# Patient Record
Sex: Female | Born: 1965 | Race: White | Hispanic: No | Smoking: Former smoker
Health system: Southern US, Community
[De-identification: ages and names within clinical notes are randomized; demographics above are authoritative.]

## PROBLEM LIST (undated history)

## (undated) DIAGNOSIS — F32A Depression, unspecified: Secondary | ICD-10-CM

## (undated) DIAGNOSIS — M1990 Unspecified osteoarthritis, unspecified site: Secondary | ICD-10-CM

## (undated) DIAGNOSIS — N189 Chronic kidney disease, unspecified: Secondary | ICD-10-CM

## (undated) DIAGNOSIS — E119 Type 2 diabetes mellitus without complications: Secondary | ICD-10-CM

## (undated) DIAGNOSIS — F419 Anxiety disorder, unspecified: Secondary | ICD-10-CM

## (undated) HISTORY — DX: Depression, unspecified: F32.A

## (undated) HISTORY — PX: FOOT SURGERY: SHX648

## (undated) HISTORY — PX: APPENDECTOMY: SHX54

## (undated) HISTORY — PX: ABDOMINAL HYSTERECTOMY: SHX81

## (undated) HISTORY — DX: Type 2 diabetes mellitus without complications: E11.9

## (undated) HISTORY — PX: COLON SURGERY: SHX602

## (undated) HISTORY — DX: Unspecified osteoarthritis, unspecified site: M19.90

---

## 2019-08-11 ENCOUNTER — Ambulatory Visit: Payer: Self-pay | Attending: Internal Medicine

## 2019-08-11 DIAGNOSIS — Z23 Encounter for immunization: Secondary | ICD-10-CM | POA: Insufficient documentation

## 2019-08-11 NOTE — Progress Notes (Signed)
   [id number]  Name:  Shequila Neglia X.    MRN: [id number] DOB: 11-15-1965  08/11/2019  Ms. Schipman Aline August was observed post Covid-19 immunization for 15 minutes without incident. She was provided with Vaccine Information Sheet and instruction to access the V-Safe system.   Ms. Maleta Pacha was instructed to call 911 with any severe reactions post vaccine: Marland Kitchen Difficulty breathing  . Swelling of face and throat  . A fast heartbeat  . A bad rash all over body  . Dizziness and weakness   Immunizations Administered    Name Date Dose VIS Date Route   Pfizer COVID-19 Vaccine 08/11/2019  1:12 PM 0.3 mL 05/17/2019 Intramuscular   Manufacturer: [employer], [employer]   Lot: [id number]   NDC: [address]-0

## 2019-09-10 ENCOUNTER — Ambulatory Visit: Payer: Self-pay | Attending: Internal Medicine

## 2019-09-10 DIAGNOSIS — Z23 Encounter for immunization: Secondary | ICD-10-CM

## 2019-09-10 NOTE — Progress Notes (Signed)
   [id number]  Name:  Marra Fraga X.    MRN: [id number] DOB: May 11, 1966  09/10/2019  Ms. Schipman Aline August was observed post Covid-19 immunization for 15 minutes without incident. She was provided with Vaccine Information Sheet and instruction to access the V-Safe system.   Ms. Anyah Swallow was instructed to call 911 with any severe reactions post vaccine: Marland Kitchen Difficulty breathing  . Swelling of face and throat  . A fast heartbeat  . A bad rash all over body  . Dizziness and weakness   Immunizations Administered    Name Date Dose VIS Date Route   Pfizer COVID-19 Vaccine 09/10/2019 11:26 AM 0.3 mL 05/17/2019 Intramuscular   Manufacturer: [employer], [employer]   Lot: [id number]   NDC: [address]-9

## 2019-12-04 ENCOUNTER — Other Ambulatory Visit: Payer: Self-pay

## 2019-12-05 ENCOUNTER — Encounter: Payer: Self-pay | Admitting: Family Medicine

## 2019-12-05 ENCOUNTER — Ambulatory Visit (INDEPENDENT_AMBULATORY_CARE_PROVIDER_SITE_OTHER): Payer: Medicare Other | Admitting: Family Medicine

## 2019-12-05 VITALS — BP 108/70 | HR 75 | Temp 96.9°F | Ht 63.0 in | Wt 212.3 lb

## 2019-12-05 DIAGNOSIS — I10 Essential (primary) hypertension: Secondary | ICD-10-CM | POA: Diagnosis not present

## 2019-12-05 DIAGNOSIS — G894 Chronic pain syndrome: Secondary | ICD-10-CM | POA: Diagnosis not present

## 2019-12-05 DIAGNOSIS — E7800 Pure hypercholesterolemia, unspecified: Secondary | ICD-10-CM | POA: Diagnosis not present

## 2019-12-05 DIAGNOSIS — F418 Other specified anxiety disorders: Secondary | ICD-10-CM

## 2019-12-05 DIAGNOSIS — F5105 Insomnia due to other mental disorder: Secondary | ICD-10-CM | POA: Diagnosis not present

## 2019-12-05 DIAGNOSIS — E6609 Other obesity due to excess calories: Secondary | ICD-10-CM

## 2019-12-05 DIAGNOSIS — E119 Type 2 diabetes mellitus without complications: Secondary | ICD-10-CM

## 2019-12-05 DIAGNOSIS — Z0000 Encounter for general adult medical examination without abnormal findings: Secondary | ICD-10-CM | POA: Insufficient documentation

## 2019-12-05 DIAGNOSIS — Z6837 Body mass index (BMI) 37.0-37.9, adult: Secondary | ICD-10-CM

## 2019-12-05 NOTE — Progress Notes (Signed)
New Patient Office Visit  Subjective:  Patient ID: Kayla Ash., female    DOB: 03-12-1966  Age: 54 y.o. MRN: [id number]  CC:  Chief Complaint  Patient presents with  . Establish Care    New patient, no concerns    HPI Kayla Ash. presents for establishment of care for ongoing follow-up.  Past medical history of hypertension, elevated cholesterol, diabetes, depression, chronic pain syndrome secondary to degenerative disc disease in her lumbar spine, and obesity.  Status post complete abdominal hysterectomy in 2004.  She has been on Estrace since that time.  She has yearly mammograms.  Status post recent eye check.  Recently diagnosed with nonalcoholic fatty liver disease through an abdominal ultrasound.  She is seeing pain management.  Exercise is difficult for her secondary to her chronic arthritic pains.  She was a pediatric nurse for 27 years.  She is married.  She takes Ambien nightly.  Denies any sleep aberration.  Past Medical History:  Diagnosis Date  . Arthritis   . Depression   . Diabetes mellitus without complication [facility])     Past Surgical History:  Procedure Laterality Date  . ABDOMINAL HYSTERECTOMY    . APPENDECTOMY    . COLON SURGERY    . FOOT SURGERY      Family History  Problem Relation Age of Onset  . Diabetes Mother   . Cancer Mother   . Breast cancer Mother   . Stroke Father     Social History   Socioeconomic History  . Marital status: Legally Separated    Spouse name: Not on file  . Number of children: Not on file  . Years of education: Not on file  . Highest education level: Not on file  Occupational History  . Not on file  Tobacco Use  . Smoking status: Never Smoker  . Smokeless tobacco: Never Used  Vaping Use  . Vaping Use: Never used  Substance and Sexual Activity  . Alcohol use: Never  . Drug use: Never  . Sexual activity: Yes  Other Topics Concern  . Not on file  Social History Narrative  . Not on file    Social Determinants of Health   Financial Resource Strain:   . Difficulty of Paying Living Expenses:   Food Insecurity:   . Worried About Charity fundraiser in the Last Year:   . Arboriculturist in the Last Year:   Transportation Needs:   . Film/video editor (Medical):   Marland Kitchen Lack of Transportation (Non-Medical):   Physical Activity:   . Days of Exercise per Week:   . Minutes of Exercise per Session:   Stress:   . Feeling of Stress :   Social Connections:   . Frequency of Communication with Friends and Family:   . Frequency of Social Gatherings with Friends and Family:   . Attends Religious Services:   . Active Member of Clubs or Organizations:   . Attends Archivist Meetings:   Marland Kitchen Marital Status:   Intimate Partner Violence:   . Fear of Current or Ex-Partner:   . Emotionally Abused:   Marland Kitchen Physically Abused:   . Sexually Abused:     ROS Review of Systems  Constitutional: Negative.   HENT: Negative.   Respiratory: Negative.   Cardiovascular: Negative.   Gastrointestinal: Negative.   Endocrine: Negative for polyphagia.  Genitourinary: Negative.   Musculoskeletal: Positive for arthralgias, back pain and gait problem.  Skin: Negative for  pallor and rash.  Allergic/Immunologic: Negative for immunocompromised state.  Neurological: Negative for tremors and speech difficulty.  Psychiatric/Behavioral: Positive for dysphoric mood and sleep disturbance.    Objective:   Today's Vitals: BP 108/70   Pulse 75   Temp (!) 96.9 F (36.1 C) (Tympanic)   Ht 5' 3"  (1.6 m)   Wt 212 lb 4.8 oz (96.3 kg)   SpO2 95%   BMI 37.61 kg/m   Physical Exam Nursing note reviewed.  Constitutional:      Appearance: Normal appearance. She is obese.  HENT:     Head: Normocephalic and atraumatic.     Right Ear: Tympanic membrane, ear canal and external ear normal.     Left Ear: Tympanic membrane, ear canal and external ear normal.  Eyes:     General: No scleral icterus.        Right eye: No discharge.        Left eye: No discharge.     Extraocular Movements: Extraocular movements intact.     Conjunctiva/sclera: Conjunctivae normal.     Pupils: Pupils are equal, round, and reactive to light.  Cardiovascular:     Rate and Rhythm: Normal rate and regular rhythm.     Pulses:          Dorsalis pedis pulses are 1+ on the right side and 1+ on the left side.       Posterior tibial pulses are 1+ on the right side and 1+ on the left side.  Pulmonary:     Effort: Pulmonary effort is normal.     Breath sounds: Normal breath sounds.  Musculoskeletal:     Cervical back: No rigidity or tenderness.     Right lower leg: No edema.     Left lower leg: No edema.  Lymphadenopathy:     Cervical: No cervical adenopathy.  Skin:    General: Skin is warm and dry.  Neurological:     Mental Status: She is alert and oriented to person, place, and time.  Psychiatric:        Mood and Affect: Mood normal.        Behavior: Behavior normal.     Assessment & Plan:   Problem List Items Addressed This Visit      Cardiovascular and Mediastinum   Essential hypertension   Relevant Medications   lisinopril (ZESTRIL) 5 MG tablet   rosuvastatin (CRESTOR) 40 MG tablet     Endocrine   Type 2 diabetes mellitus without complication, without long-term current use of insulin (HCC)   Relevant Medications   lisinopril (ZESTRIL) 5 MG tablet   metFORMIN (GLUCOPHAGE-XR) 500 MG 24 hr tablet   rosuvastatin (CRESTOR) 40 MG tablet     Other   Chronic pain syndrome   Relevant Medications   FLUoxetine (PROZAC) 20 MG capsule   ibuprofen (ADVIL) 200 MG tablet   oxyCODONE (ROXICODONE) 15 MG immediate release tablet   OXYCONTIN 60 MG 12 hr tablet   Elevated cholesterol   Relevant Medications   lisinopril (ZESTRIL) 5 MG tablet   rosuvastatin (CRESTOR) 40 MG tablet   Insomnia secondary to depression with anxiety - Primary   Relevant Medications   FLUoxetine (PROZAC) 20 MG capsule   Class 2  obesity due to excess calories with body mass index (BMI) of 37.0 to 37.9 in adult   Relevant Medications   metFORMIN (GLUCOPHAGE-XR) 500 MG 24 hr tablet   Other Relevant Orders   Amb Ref to Medical Weight Management   Healthcare  maintenance   Relevant Orders   Ambulatory referral to Gynecology      Outpatient Encounter Medications as of 12/05/2019  Medication Sig  . albuterol (VENTOLIN HFA) 108 (90 Base) MCG/ACT inhaler Inhale into the lungs.  . Blood Glucose Monitoring Suppl (ONE TOUCH ULTRA 2) w/Device KIT   . Blood Glucose Monitoring Suppl (ONE TOUCH ULTRA 2) w/Device KIT   . budesonide-formoterol (SYMBICORT) 160-4.5 MCG/ACT inhaler Inhale into the lungs.  Marland Kitchen estradiol (ESTRACE) 2 MG tablet Take 2 mg by mouth daily.  Marland Kitchen FLUoxetine (PROZAC) 20 MG capsule Take 20 mg by mouth daily.  . fluticasone (FLONASE) 50 MCG/ACT nasal spray 2 sprays by Each Nare route daily.  Marland Kitchen glucose blood test strip   . glycopyrrolate (ROBINUL) 2 MG tablet Take 2 mg by mouth daily.  Marland Kitchen ibuprofen (ADVIL) 200 MG tablet Take by mouth.  Marland Kitchen LINZESS 290 MCG CAPS capsule Take 290 mcg by mouth daily.  Marland Kitchen lisinopril (ZESTRIL) 5 MG tablet Take 5 mg by mouth daily.  . Magnesium Oxide -Mg Supplement 250 MG TABS Take 250 tablets by mouth daily.  . metFORMIN (GLUCOPHAGE-XR) 500 MG 24 hr tablet Take 500 mg by mouth 4 (four) times daily.  . montelukast (SINGULAIR) 10 MG tablet Take 10 mg by mouth at bedtime.  Marland Kitchen omeprazole (PRILOSEC) 20 MG capsule   . ondansetron (ZOFRAN) 8 MG tablet TK 1 T PO D  . oxyCODONE (ROXICODONE) 15 MG immediate release tablet Take 15 mg by mouth every 4 (four) hours.  . OXYCONTIN 60 MG 12 hr tablet Take 60 tablets by mouth 2 (two) times daily.  . polyethylene glycol powder (GLYCOLAX/MIRALAX) 17 GM/SCOOP powder DISSOLVE 17 GRAMS AND TK PO PRN  . rosuvastatin (CRESTOR) 40 MG tablet Take 40 mg by mouth daily.  Marland Kitchen zolpidem (AMBIEN) 5 MG tablet Take 5 mg by mouth at bedtime as needed.   No  facility-administered encounter medications on file as of 12/05/2019.    Follow-up: Return in about 3 months (around 03/06/2020).   We will proceed with previously ordered liver elasticity scan.  Agrees to go to weight loss management for weight loss and treatment of fatty liver disease.  Will see GYN for possible taper of her Estrace and follow-up well woman care. Libby Maw, MD

## 2019-12-16 ENCOUNTER — Encounter (INDEPENDENT_AMBULATORY_CARE_PROVIDER_SITE_OTHER): Payer: Self-pay

## 2020-01-13 ENCOUNTER — Other Ambulatory Visit: Payer: Self-pay | Admitting: Neurosurgery

## 2020-01-13 DIAGNOSIS — M4317 Spondylolisthesis, lumbosacral region: Secondary | ICD-10-CM

## 2020-01-21 ENCOUNTER — Telehealth: Payer: Self-pay

## 2020-01-21 NOTE — Telephone Encounter (Signed)
Left message for patient to call back. Please schedule Annual Medicare Wellness Exam with Nurse Health Advisor.

## 2020-02-05 ENCOUNTER — Ambulatory Visit
Admission: RE | Admit: 2020-02-05 | Discharge: 2020-02-05 | Disposition: A | Discharge status: Home / Self Care | Payer: Medicare Other | Source: Ambulatory Visit | Attending: Neurosurgery | Admitting: Neurosurgery

## 2020-02-05 ENCOUNTER — Other Ambulatory Visit: Payer: Self-pay

## 2020-02-05 DIAGNOSIS — M4317 Spondylolisthesis, lumbosacral region: Secondary | ICD-10-CM

## 2020-02-05 IMAGING — MR MR LUMBAR SPINE W/O CM
4 of 5 series · 18 of 48 positions shown · non-contrast
Comparison: None.

CLINICAL DATA: Low back pain with left hip and leg pain. Tingling
and numbness.

EXAM:
MRI LUMBAR SPINE WITHOUT CONTRAST
TECHNIQUE: Multiplanar, multisequence MR imaging of the lumbar spine was
performed. No intravenous contrast was administered.

[Series 5: T2 · sagittal · 4.0mm · 0.73mm/px · 6 of 15 slices shown (1 of 2)]
[im 1/15]
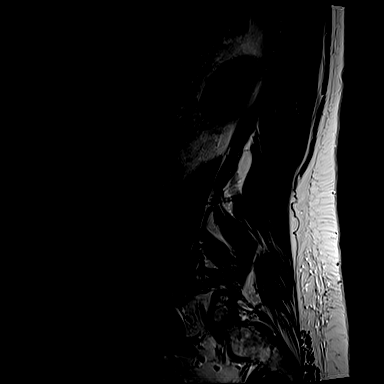
[im 3/15]
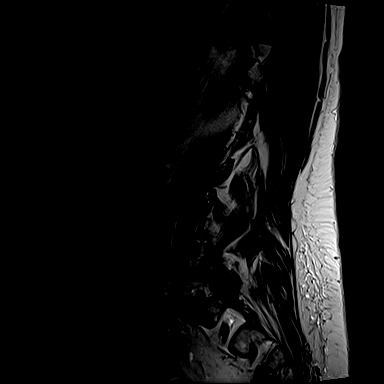
[im 6/15]
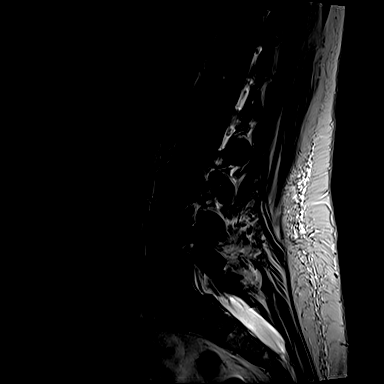
[im 9/15]
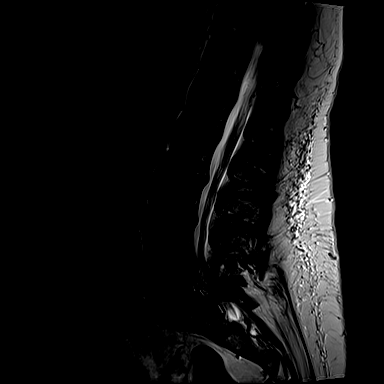
[im 12/15]
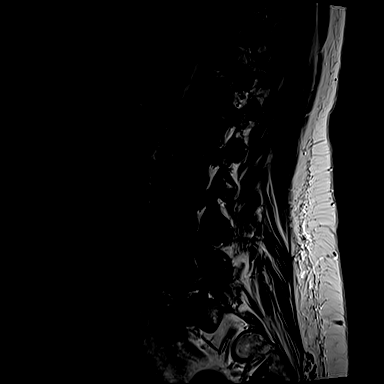
[im 15/15]
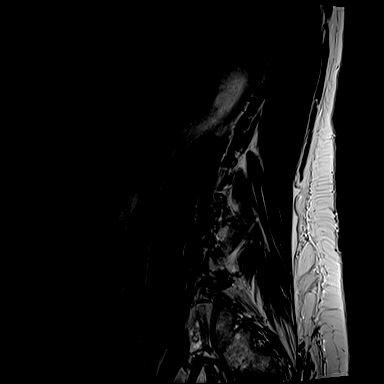

[Series 6: T1 · sagittal · 4.0mm · 0.73mm/px · 3 of 15 slices shown (1 of 2)]
[im 3/15]
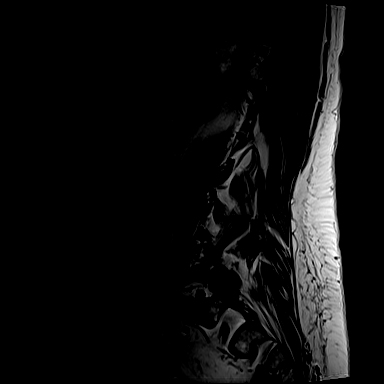
[im 9/15]
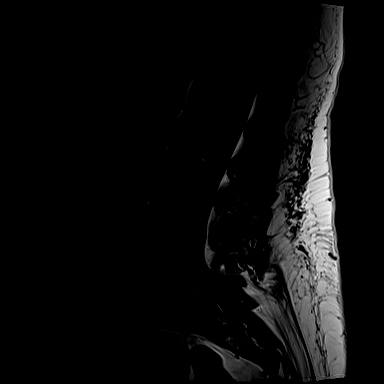
[im 15/15]
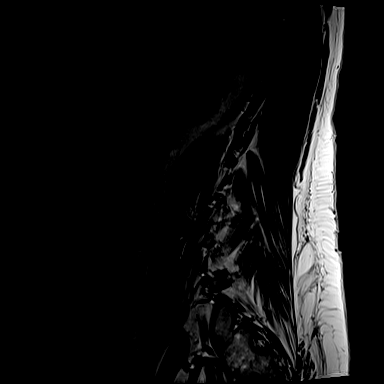

[Series 10: T1 · axial · 4.0mm · 0.28mm/px · z∈[-113,+52]mm · 3 of 39 slices shown (2 of 2)]
[im 6/39]
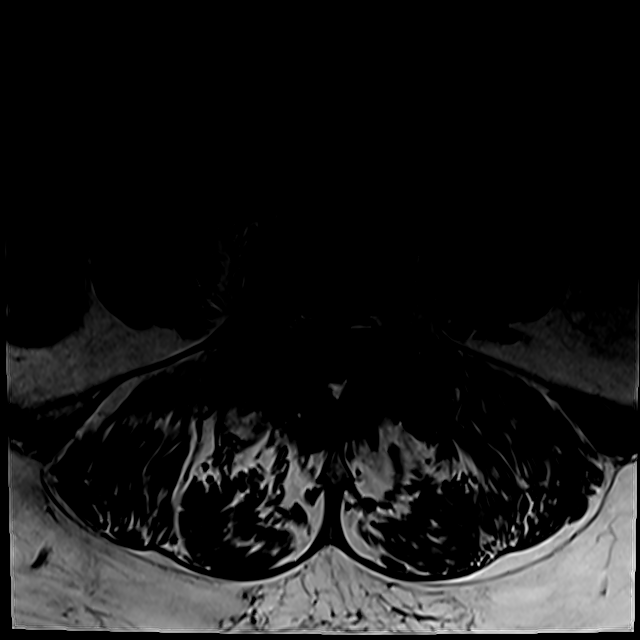
[im 20/39]
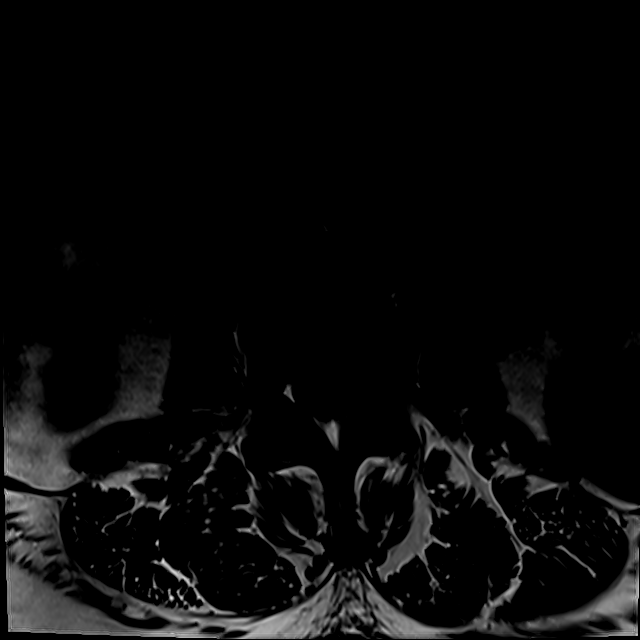
[im 33/39]
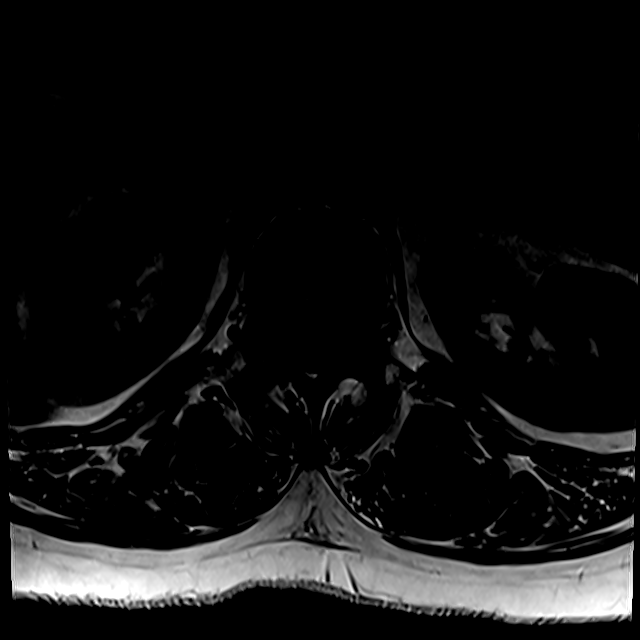

[Series 13: T2 · axial · 4.0mm · 0.28mm/px · z∈[-137,+52]mm · 6 of 39 slices shown (2 of 2)]
[im 1/39]
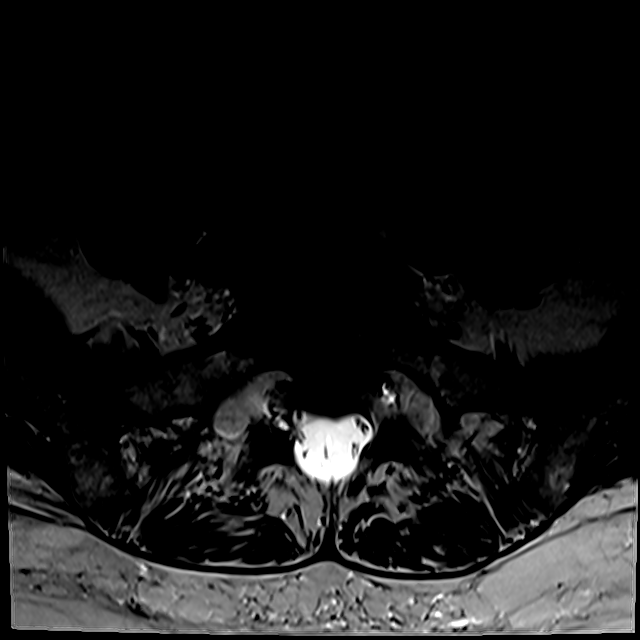
[im 6/39]
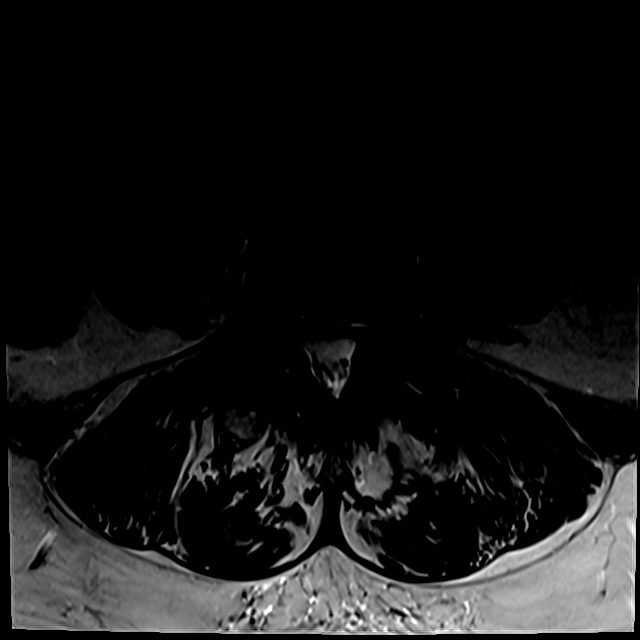
[im 11/39]
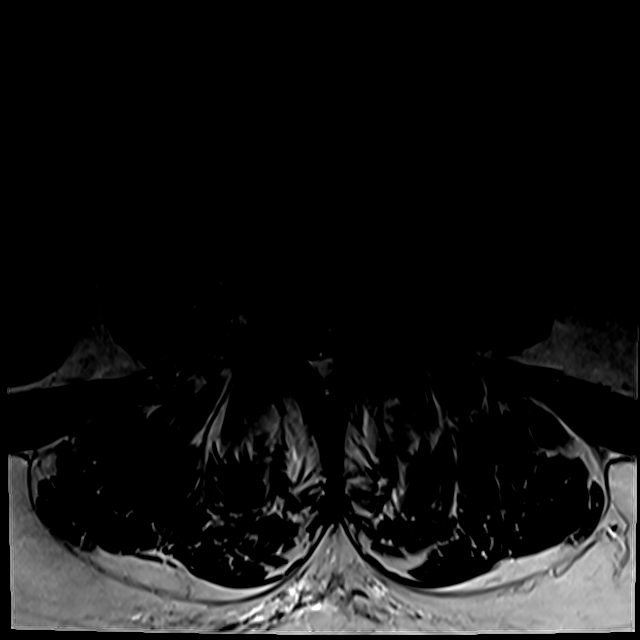
[im 17/39]
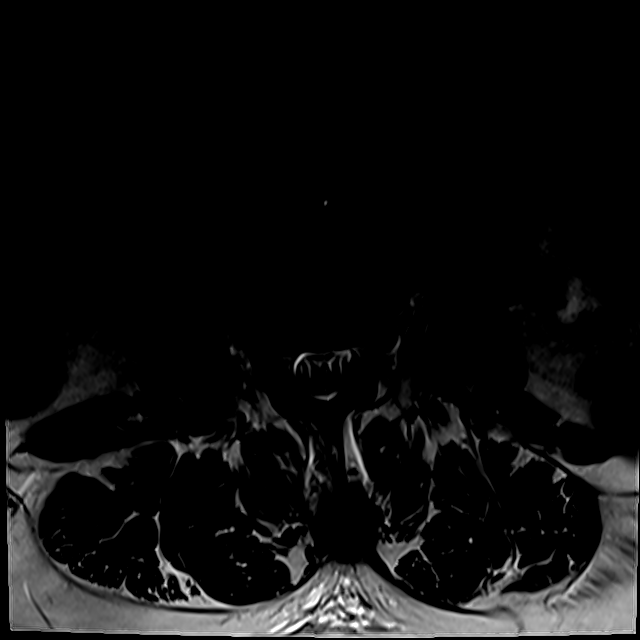
[im 20/39]
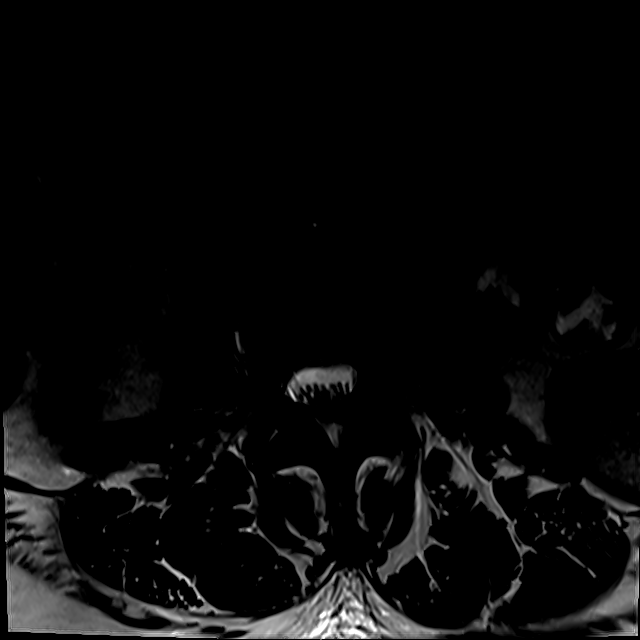
[im 33/39]
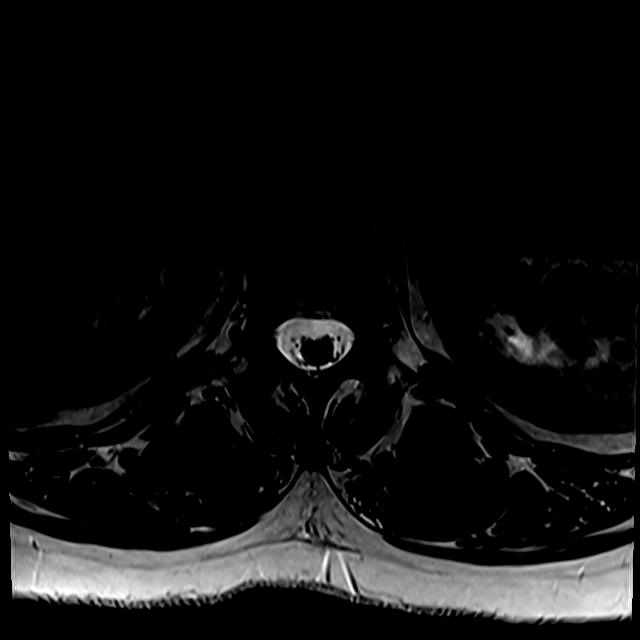

[18 of 48 positions shown; findings below may reference images not displayed]

FINDINGS: Segmentation:  5 lumbar type vertebrae

Alignment: 8 mm of anterolisthesis at L5-S1. No certain pars
defects, limited by the degree of degenerative spurring, but
possible on the right. Severe facet arthritis. Mild dextroscoliosis.

Vertebrae:  No fracture, evidence of discitis, or bone lesion.

Conus medullaris and cauda equina: Conus extends to the L2 level.
Conus and cauda equina appear normal.

Paraspinal and other soft tissues: Negative for perispinal mass or
inflammation. Incidental renal sinus cysts.

Disc levels:

T12- L1: Unremarkable.

L1-L2: Unremarkable.

L2-L3: Disc narrowing and bulging without impingement. Asymmetric
left facet spurring that is still mild

L3-L4: Disc narrowing and bulging. Minor facet spurring. Mild spinal
stenosis

L4-L5: Facet osteoarthritis with bony and ligamentous hypertrophy.
The disc is narrowed and bulging and there is high-grade spinal
stenosis with complete effacement of CSF. Moderate right and
mild-to-moderate left foraminal narrowing.

L5-S1:Severe facet osteoarthritis with spurring and anterolisthesis.
The disc is narrowed and bulging. Right foraminal impingement with
nerve root flattening. Milder left foraminal narrowing. Patent
spinal canal
IMPRESSION: 1. Lower lumbar degeneration with L5-S1 anterolisthesis and mild
scoliosis. There could be L5 pars defect underlying the L5-S1
anterolisthesis, especially on the right.
2. L4-5 high-grade spinal stenosis and moderate right foraminal
impingement.
3. L5-S1 right foraminal impingement.

## 2020-02-20 ENCOUNTER — Other Ambulatory Visit: Payer: Self-pay | Admitting: Neurosurgery

## 2020-03-02 NOTE — Progress Notes (Signed)
[id number] - [place], [place] - [address] AT [facility] [address] [place] [address] Phone: [phone] Fax: [phone]      Your procedure is scheduled on 03/06/20.  Report to [facility] Main Entrance "A" at 7:00 A.M., and check in at the Admitting office.  Call this number if you have problems the morning of surgery:  [phone]  Call [phone] if you have any questions prior to your surgery date Monday-Friday 8am-4pm    Remember:  Do not eat or drink after midnight the night before your surgery    Take these medicines the morning of surgery with A SIP OF WATER: albuterol (VENTOLIN HFA) - as prescribed budesonide-formoterol (SYMBICORT) estradiol (ESTRACE) FLUoxetine (PROZAC)  fluticasone (FLONASE)  glycopyrrolate (ROBINUL) ondansetron (ZOFRAN) - as needed oxyCODONE (ROXICODONE) - as needed OXYCONTIN   As of today, STOP taking any Aspirin (unless otherwise instructed by your surgeon) Aleve, Naproxen, Ibuprofen, Motrin, Advil, Goody's, BC's, all herbal medications, fish oil, and all vitamins.   WHAT DO I DO ABOUT MY DIABETES MEDICATION?   Marland Kitchen Do not take oral diabetes medicines (pills) the morning of surgery.  HOW TO MANAGE YOUR DIABETES BEFORE AND AFTER SURGERY  Why is it important to control my blood sugar before and after surgery? . Improving blood sugar levels before and after surgery helps healing and can limit problems. . A way of improving blood sugar control is eating a healthy diet by: o  Eating less sugar and carbohydrates o  Increasing activity/exercise o  Talking with your doctor about reaching your blood sugar goals . High blood sugars (greater than 180 mg/dL) can raise your risk of infections and slow your recovery, so you will need to focus on controlling your diabetes during the weeks before surgery. . Make sure that the doctor who takes care of your diabetes knows about your planned surgery including the date and  location.  How do I manage my blood sugar before surgery? . Check your blood sugar at least 4 times a day, starting 2 days before surgery, to make sure that the level is not too high or low. . Check your blood sugar the morning of your surgery when you wake up and every 2 hours until you get to the Short Stay unit. o If your blood sugar is less than 70 mg/dL, you will need to treat for low blood sugar: - Do not take insulin. - Treat a low blood sugar (less than 70 mg/dL) with  cup of clear juice (cranberry or apple), 4 glucose tablets, OR glucose gel. - Recheck blood sugar in 15 minutes after treatment (to make sure it is greater than 70 mg/dL). If your blood sugar is not greater than 70 mg/dL on recheck, call [phone] for further instructions. . Report your blood sugar to the short stay nurse when you get to Short Stay.  . If you are admitted to the hospital after surgery: o Your blood sugar will be checked by the staff and you will probably be given insulin after surgery (instead of oral diabetes medicines) to make sure you have good blood sugar levels. o The goal for blood sugar control after surgery is 80-180 mg/dL.                  Do not wear jewelry, make up, or nail polish            Do not wear lotions, powders, perfumes or deodorant.  Do not shave 48 hours prior to surgery.              Do not bring valuables to the hospital.            [facility] is not responsible for any belongings or valuables.  Do NOT Smoke (Tobacco/Vaping) or drink Alcohol 24 hours prior to your procedure If you use a CPAP at night, you may bring all equipment for your overnight stay.   Contacts, glasses, dentures or bridgework may not be worn into surgery.      For patients admitted to the hospital, discharge time will be determined by your treatment team.   Patients discharged the day of surgery will not be allowed to drive home, and someone needs to stay with them for 24  hours.    Special instructions:   [facility]- Preparing For Surgery  Before surgery, you can play an important role. Because skin is not sterile, your skin needs to be as free of germs as possible. You can reduce the number of germs on your skin by washing with CHG (chlorahexidine gluconate) Soap before surgery.  CHG is an antiseptic cleaner which kills germs and bonds with the skin to continue killing germs even after washing.    Oral Hygiene is also important to reduce your risk of infection.  Remember - BRUSH YOUR TEETH THE MORNING OF SURGERY WITH YOUR REGULAR TOOTHPASTE  Please do not use if you have an allergy to CHG or antibacterial soaps. If your skin becomes reddened/irritated stop using the CHG.  Do not shave (including legs and underarms) for at least 48 hours prior to first CHG shower. It is OK to shave your face.  Please follow these instructions carefully.   1. Shower the NIGHT BEFORE SURGERY and the MORNING OF SURGERY with CHG Soap.   2. If you chose to wash your hair, wash your hair first as usual with your normal shampoo.  3. After you shampoo, rinse your hair and body thoroughly to remove the shampoo.  4. Use CHG as you would any other liquid soap. You can apply CHG directly to the skin and wash gently with a scrungie or a clean washcloth.   5. Apply the CHG Soap to your body ONLY FROM THE NECK DOWN.  Do not use on open wounds or open sores. Avoid contact with your eyes, ears, mouth and genitals (private parts). Wash Face and genitals (private parts)  with your normal soap.   6. Wash thoroughly, paying special attention to the area where your surgery will be performed.  7. Thoroughly rinse your body with warm water from the neck down.  8. DO NOT shower/wash with your normal soap after using and rinsing off the CHG Soap.  9. Pat yourself dry with a CLEAN TOWEL.  10. Wear CLEAN PAJAMAS to bed the night before surgery  11. Place CLEAN SHEETS on your bed the night of  your first shower and DO NOT SLEEP WITH PETS.   Day of Surgery: Wear Clean/Comfortable clothing the morning of surgery Do not apply any deodorants/lotions.   Remember to brush your teeth WITH YOUR REGULAR TOOTHPASTE.   Please read over the following fact sheets that you were given.

## 2020-03-03 ENCOUNTER — Encounter (HOSPITAL_COMMUNITY): Payer: Self-pay

## 2020-03-03 ENCOUNTER — Other Ambulatory Visit: Payer: Self-pay

## 2020-03-03 ENCOUNTER — Encounter (HOSPITAL_COMMUNITY)
Admission: RE | Admit: 2020-03-03 | Discharge: 2020-03-03 | Disposition: A | Discharge status: Home / Self Care | Payer: Medicare Other | Source: Ambulatory Visit | Attending: Neurosurgery | Admitting: Neurosurgery

## 2020-03-03 ENCOUNTER — Other Ambulatory Visit (HOSPITAL_COMMUNITY)
Admission: RE | Admit: 2020-03-03 | Discharge: 2020-03-03 | Disposition: A | Discharge status: Home / Self Care | Payer: Medicare Other | Source: Ambulatory Visit | Attending: Neurosurgery | Admitting: Neurosurgery

## 2020-03-03 DIAGNOSIS — I10 Essential (primary) hypertension: Secondary | ICD-10-CM | POA: Insufficient documentation

## 2020-03-03 DIAGNOSIS — Z01812 Encounter for preprocedural laboratory examination: Secondary | ICD-10-CM | POA: Insufficient documentation

## 2020-03-03 DIAGNOSIS — Z20822 Contact with and (suspected) exposure to covid-19: Secondary | ICD-10-CM | POA: Insufficient documentation

## 2020-03-03 HISTORY — DX: Anxiety disorder, unspecified: F41.9

## 2020-03-03 HISTORY — DX: Chronic kidney disease, unspecified: N18.9

## 2020-03-03 LAB — BASIC METABOLIC PANEL
Anion gap: 8 (ref 5–15)
BUN: 14 mg/dL (ref 6–20)
CO2: 24 mmol/L (ref 22–32)
Calcium: 9.3 mg/dL (ref 8.9–10.3)
Chloride: 105 mmol/L (ref 98–111)
Creatinine, Ser: 0.84 mg/dL (ref 0.44–1.00)
GFR calc Af Amer: >60 mL/min (ref >60)
GFR calc non Af Amer: >60 mL/min (ref >60)
Glucose, Bld: 118 mg/dL — ABNORMAL HIGH (ref 70–99)
Potassium: 4.3 mmol/L (ref 3.5–5.1)
Sodium: 137 mmol/L (ref 135–145)

## 2020-03-03 LAB — HEMOGLOBIN A1C
Hgb A1c MFr Bld: 5.8 % — ABNORMAL HIGH (ref 4.8–5.6)
Mean Plasma Glucose: 119.76 mg/dL

## 2020-03-03 LAB — TYPE AND SCREEN
ABO/RH(D): A POS
Antibody Screen: NEGATIVE

## 2020-03-03 LAB — CBC
HCT: 41 % (ref 36.0–46.0)
Hemoglobin: 13.1 g/dL (ref 12.0–15.0)
MCH: 29.4 pg (ref 26.0–34.0)
MCHC: 32 g/dL (ref 30.0–36.0)
MCV: 92.1 fL (ref 80.0–100.0)
Platelets: 268 10*3/uL (ref 150–400)
RBC: 4.45 MIL/uL (ref 3.87–5.11)
RDW: 12.9 % (ref 11.5–15.5)
WBC: 5.5 10*3/uL (ref 4.0–10.5)
nRBC: 0 % (ref 0.0–0.2)

## 2020-03-03 LAB — SARS CORONAVIRUS 2 (TAT 6-24 HRS): SARS Coronavirus 2: NEGATIVE

## 2020-03-03 LAB — SURGICAL PCR SCREEN
MRSA, PCR: NEGATIVE
Staphylococcus aureus: POSITIVE — AB

## 2020-03-03 LAB — GLUCOSE, CAPILLARY: Glucose-Capillary: 124 mg/dL — ABNORMAL HIGH (ref 70–99)

## 2020-03-03 MED ORDER — CHLORHEXIDINE GLUCONATE CLOTH 2 % EX PADS: 6.0000 | MEDICATED_PAD | Freq: Once | CUTANEOUS | Status: DC

## 2020-03-03 NOTE — Progress Notes (Signed)
PCP - DR    Katrinka Blazing AT [facility]  Cardiologist - NA    Chest x-ray - NA EKG - 9/28 Stress Test - NA ECHO - NA Cardiac Cath - NA   Fasting Blood Sugar - 120 Checks Blood Sugar __2___ times a day   Aspirin Instructions:STOP   COVID TEST- FOR TODAY   Anesthesia review: ESSENTIAL HTN  Patient denies shortness of breath, fever, cough and chest pain at PAT appointment   All instructions explained to the patient, with a verbal understanding of the material. Patient agrees to go over the instructions while at home for a better understanding. Patient also instructed to self quarantine after being tested for COVID-19. The opportunity to ask questions was provided.

## 2020-03-03 NOTE — Progress Notes (Deleted)
PCP - DR Katrinka Blazing AT [facility] Cardiologist - NA   -  Chest x-ray - NA EKG - 9/28    -   Fasting Blood Sugar - 120 Checks Blood Sugar _2____ times a day   s: Aspirin Instructions:STOP  2-  OVID TEST- FOR TODAY   Anesthesia review: ESSENTIAL HTN  Patient denies shortness of breath, fever, cough and chest pain at PAT appointment   All instructions explained to the patient, with a verbal understanding of the material. Patient agrees to go over the instructions while at home for a better understanding. Patient also instructed to self quarantine after being tested for COVID-19. The opportunity to ask questions was provided.

## 2020-03-06 ENCOUNTER — Other Ambulatory Visit: Payer: Self-pay

## 2020-03-06 ENCOUNTER — Encounter (HOSPITAL_COMMUNITY)
Admission: RE | Disposition: A | Discharge status: Home Health | Payer: Self-pay | Source: Home / Self Care | Attending: Neurosurgery

## 2020-03-06 ENCOUNTER — Encounter (HOSPITAL_COMMUNITY): Payer: Self-pay | Admitting: Neurosurgery

## 2020-03-06 ENCOUNTER — Inpatient Hospital Stay (HOSPITAL_COMMUNITY): Payer: Medicare Other | Admitting: Physician Assistant

## 2020-03-06 ENCOUNTER — Inpatient Hospital Stay (HOSPITAL_COMMUNITY)
Admission: RE | Admit: 2020-03-06 | Discharge: 2020-03-10 | DRG: 460 | Disposition: A | Discharge status: Home Health | Payer: Medicare Other | Attending: Neurosurgery | Admitting: Neurosurgery

## 2020-03-06 ENCOUNTER — Inpatient Hospital Stay (HOSPITAL_COMMUNITY): Payer: Medicare Other

## 2020-03-06 ENCOUNTER — Inpatient Hospital Stay (HOSPITAL_COMMUNITY): Payer: Medicare Other | Admitting: Certified Registered Nurse Anesthetist

## 2020-03-06 DIAGNOSIS — Z7984 Long term (current) use of oral hypoglycemic drugs: Secondary | ICD-10-CM

## 2020-03-06 DIAGNOSIS — M4317 Spondylolisthesis, lumbosacral region: Secondary | ICD-10-CM | POA: Diagnosis present

## 2020-03-06 DIAGNOSIS — Z79899 Other long term (current) drug therapy: Secondary | ICD-10-CM | POA: Diagnosis not present

## 2020-03-06 DIAGNOSIS — Z20822 Contact with and (suspected) exposure to covid-19: Secondary | ICD-10-CM | POA: Diagnosis present

## 2020-03-06 DIAGNOSIS — Z888 Allergy status to other drugs, medicaments and biological substances status: Secondary | ICD-10-CM

## 2020-03-06 DIAGNOSIS — Z419 Encounter for procedure for purposes other than remedying health state, unspecified: Secondary | ICD-10-CM

## 2020-03-06 DIAGNOSIS — M4316 Spondylolisthesis, lumbar region: Secondary | ICD-10-CM | POA: Diagnosis present

## 2020-03-06 LAB — ABO/RH: ABO/RH(D): A POS

## 2020-03-06 LAB — CBC
HCT: 37.3 % (ref 36.0–46.0)
Hemoglobin: 12 g/dL (ref 12.0–15.0)
MCH: 29.3 pg (ref 26.0–34.0)
MCHC: 32.2 g/dL (ref 30.0–36.0)
MCV: 91 fL (ref 80.0–100.0)
Platelets: 259 10*3/uL (ref 150–400)
RBC: 4.1 MIL/uL (ref 3.87–5.11)
RDW: 13 % (ref 11.5–15.5)
WBC: 12.3 10*3/uL — ABNORMAL HIGH (ref 4.0–10.5)
nRBC: 0 % (ref 0.0–0.2)

## 2020-03-06 LAB — GLUCOSE, CAPILLARY
Glucose-Capillary: 104 mg/dL — ABNORMAL HIGH (ref 70–99)
Glucose-Capillary: 126 mg/dL — ABNORMAL HIGH (ref 70–99)
Glucose-Capillary: 143 mg/dL — ABNORMAL HIGH (ref 70–99)
Glucose-Capillary: 134 mg/dL — ABNORMAL HIGH (ref 70–99)

## 2020-03-06 LAB — HEMOGLOBIN A1C
Hgb A1c MFr Bld: 5.9 % — ABNORMAL HIGH (ref 4.8–5.6)
Mean Plasma Glucose: 122.63 mg/dL

## 2020-03-06 LAB — CREATININE, SERUM
Creatinine, Ser: 0.97 mg/dL (ref 0.44–1.00)
GFR calc Af Amer: >60 mL/min (ref >60)
GFR calc non Af Amer: >60 mL/min (ref >60)

## 2020-03-06 IMAGING — RF DG LUMBAR SPINE 2-3V
1 series · 2 of 2 positions shown · non-contrast
Comparison: Lumbar spine MRI [DATE].

CLINICAL DATA: Surgery, elective. Additional history provided: PLIF
L4-S1. Reported fluoroscopy time 1 minutes, 52 seconds (95.29 mGy)

EXAM:
LUMBAR SPINE - 2-3 VIEW; DG C-ARM 1-60 MIN

[Series 1: run · 2 of 2 slices shown]
[im 1/2]
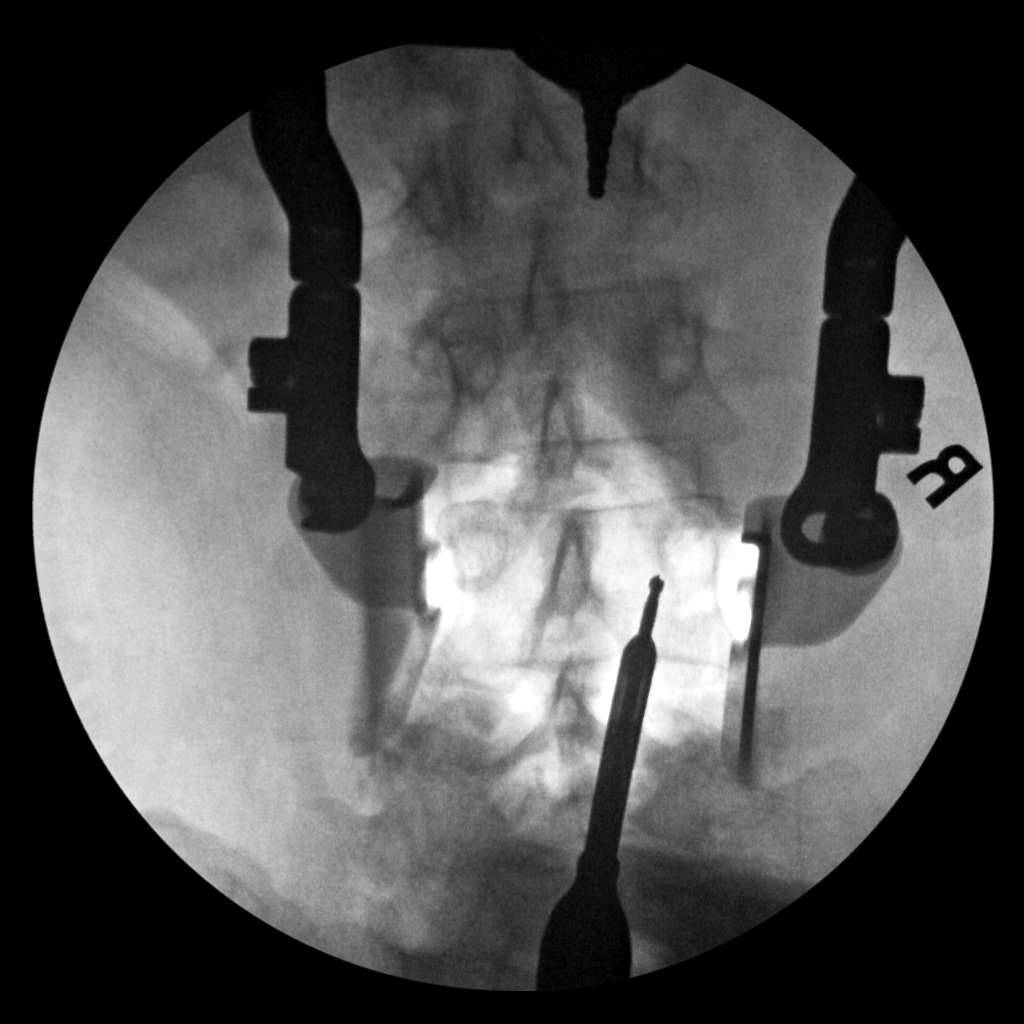
[im 2/2]
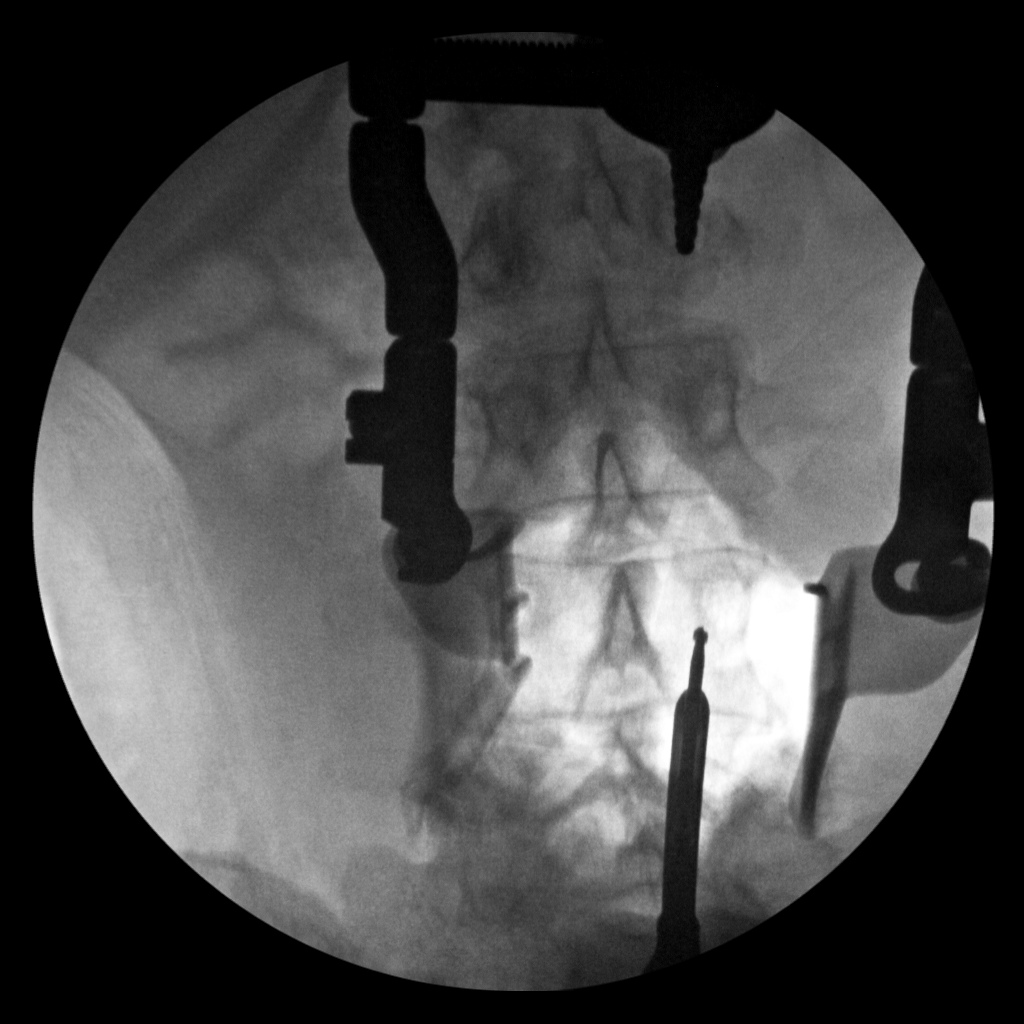

[2 of 2 positions shown; findings below may reference images not displayed]

FINDINGS: Two intraoperative PA fluoroscopic images of the lower lumbar spine
are submitted. The images demonstrate a surgical instrument
projecting over the right aspect of a lower lumbar vertebrae (the
exact level is difficult to ascertain from the images provided).
Overlying soft tissue retractors.
IMPRESSION: Two intraoperative fluoroscopic images of the lumbar spine as
described.

## 2020-03-06 IMAGING — RF DG C-ARM 1-60 MIN
1 series · 2 of 2 positions shown · non-contrast
Comparison: Lumbar spine MRI [DATE].

CLINICAL DATA: Surgery, elective. Additional history provided: PLIF
L4-S1. Reported fluoroscopy time 1 minutes, 52 seconds (95.29 mGy)

EXAM:
LUMBAR SPINE - 2-3 VIEW; DG C-ARM 1-60 MIN

[Series 1: run · 2 of 2 slices shown]
[im 1/2]
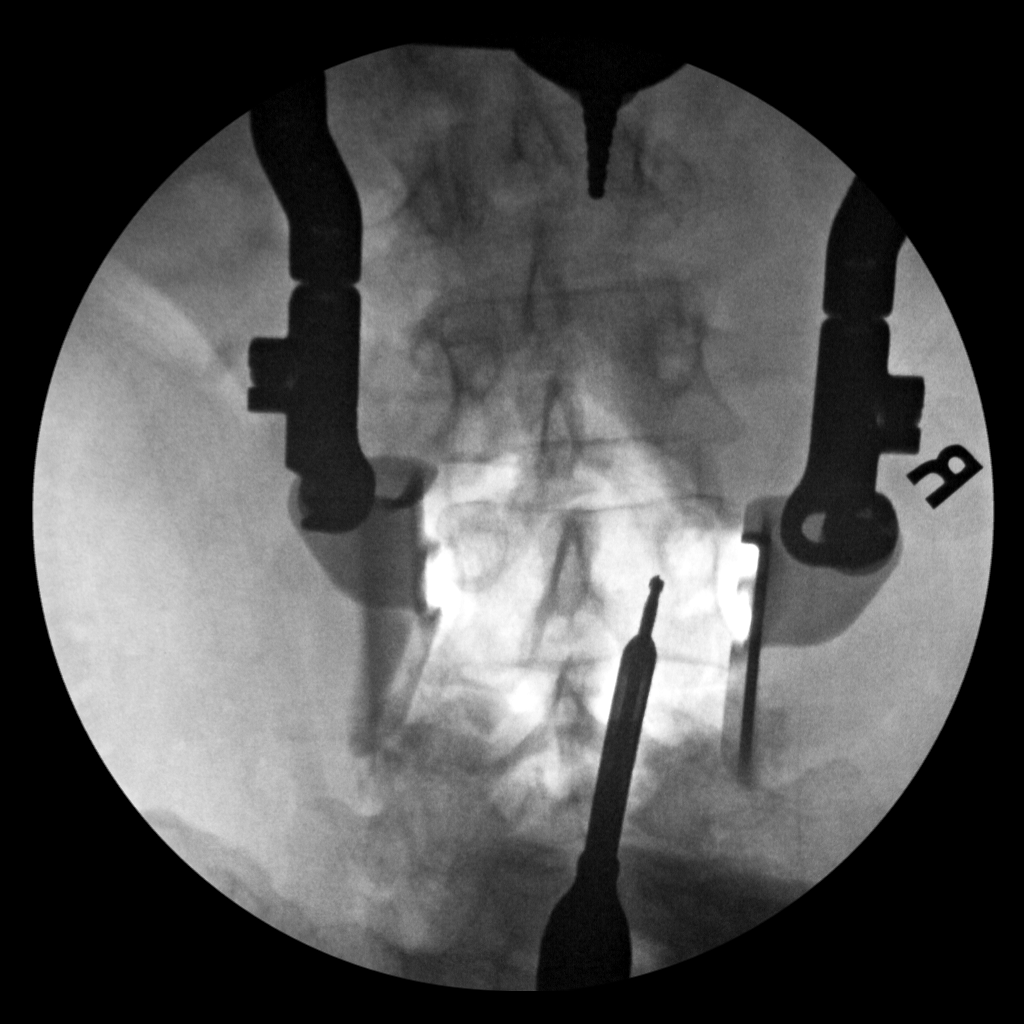
[im 2/2]
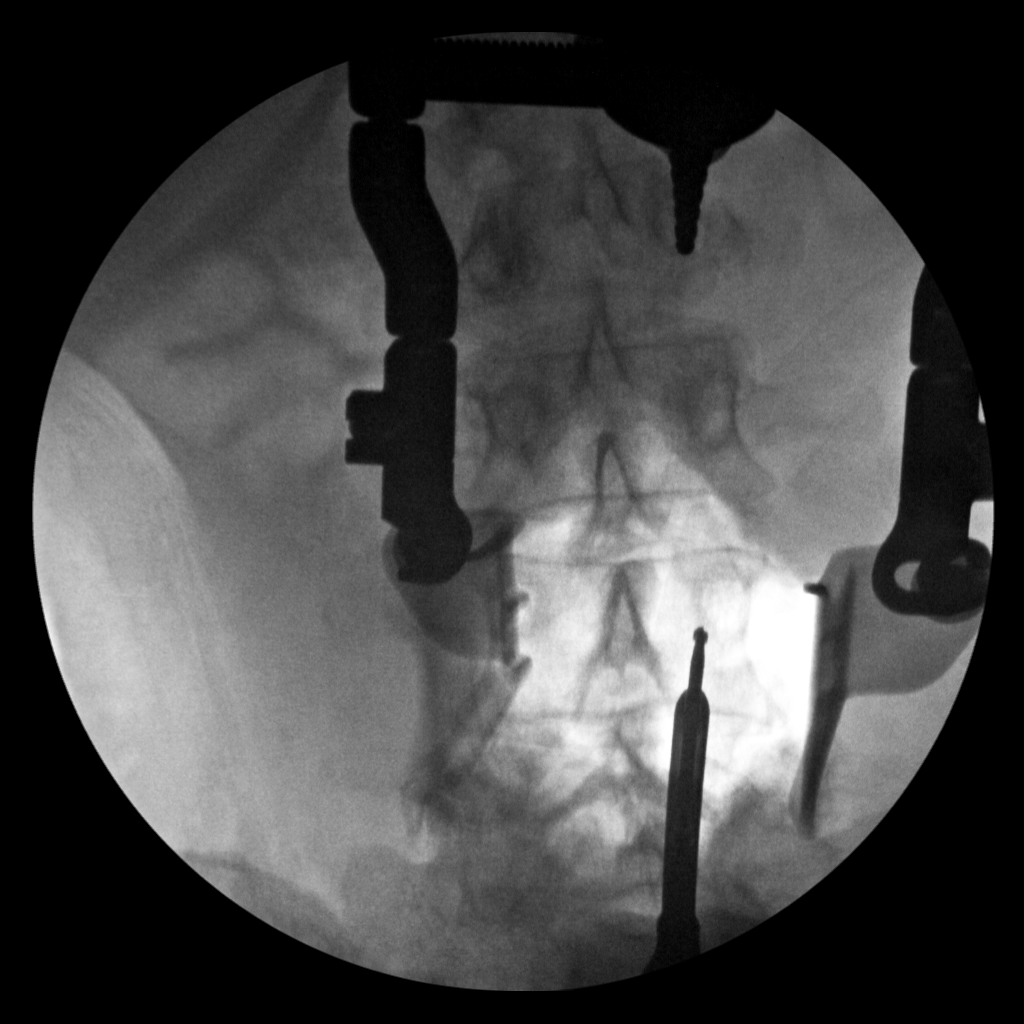

[2 of 2 positions shown; findings below may reference images not displayed]

FINDINGS: Two intraoperative PA fluoroscopic images of the lower lumbar spine
are submitted. The images demonstrate a surgical instrument
projecting over the right aspect of a lower lumbar vertebrae (the
exact level is difficult to ascertain from the images provided).
Overlying soft tissue retractors.
IMPRESSION: Two intraoperative fluoroscopic images of the lumbar spine as
described.

## 2020-03-06 SURGERY — POSTERIOR LUMBAR FUSION 2 LEVEL
Anesthesia: General

## 2020-03-06 MED ORDER — LISINOPRIL 5 MG PO TABS
5.0000 mg | ORAL_TABLET | Freq: Every day | ORAL | Status: DC
  Administered 2020-03-07 – 2020-03-10 (×4): 5 mg via ORAL
  Filled 2020-03-06 (×4): qty 1

## 2020-03-06 MED ORDER — HYDROMORPHONE HCL 1 MG/ML IJ SOLN
INTRAMUSCULAR | Status: AC
  Administered 2020-03-06: 0.5 mg via INTRAVENOUS
  Filled 2020-03-06: qty 1

## 2020-03-06 MED ORDER — SODIUM CHLORIDE 0.9% FLUSH
3.0000 mL | Freq: Two times a day (BID) | INTRAVENOUS | Status: DC
  Administered 2020-03-07 – 2020-03-10 (×7): 3 mL via INTRAVENOUS

## 2020-03-06 MED ORDER — FLUOXETINE HCL 20 MG PO CAPS
20.0000 mg | ORAL_CAPSULE | Freq: Every day | ORAL | Status: DC
  Administered 2020-03-07 – 2020-03-10 (×4): 20 mg via ORAL
  Filled 2020-03-06 (×4): qty 1

## 2020-03-06 MED ORDER — PHENYLEPHRINE HCL-NACL 10-0.9 MG/250ML-% IV SOLN
INTRAVENOUS | Status: DC | PRN
  Administered 2020-03-06: 25 ug/min via INTRAVENOUS

## 2020-03-06 MED ORDER — OXYCODONE HCL 5 MG PO TABS
10.0000 mg | ORAL_TABLET | ORAL | Status: DC | PRN
  Administered 2020-03-06 – 2020-03-07 (×4): 10 mg via ORAL
  Filled 2020-03-06 (×4): qty 2

## 2020-03-06 MED ORDER — MONTELUKAST SODIUM 10 MG PO TABS: 10.0000 mg | ORAL_TABLET | Freq: Every evening | ORAL | Status: DC | PRN

## 2020-03-06 MED ORDER — MIDAZOLAM HCL 2 MG/2ML IJ SOLN
INTRAMUSCULAR | Status: DC | PRN
  Administered 2020-03-06: 2 mg via INTRAVENOUS

## 2020-03-06 MED ORDER — LIDOCAINE 2% (20 MG/ML) 5 ML SYRINGE
INTRAMUSCULAR | Status: AC
  Filled 2020-03-06: qty 5

## 2020-03-06 MED ORDER — KETAMINE HCL 50 MG/5ML IJ SOSY
PREFILLED_SYRINGE | INTRAMUSCULAR | Status: AC
  Filled 2020-03-06: qty 5

## 2020-03-06 MED ORDER — KETOROLAC TROMETHAMINE 30 MG/ML IJ SOLN
30.0000 mg | Freq: Once | INTRAMUSCULAR | Status: AC
  Administered 2020-03-06: 30 mg via INTRAVENOUS

## 2020-03-06 MED ORDER — PROPOFOL 10 MG/ML IV BOLUS
INTRAVENOUS | Status: DC | PRN
  Administered 2020-03-06: 140 mg via INTRAVENOUS

## 2020-03-06 MED ORDER — OXYCODONE HCL 5 MG PO TABS: 5.0000 mg | ORAL_TABLET | Freq: Once | ORAL | Status: DC | PRN

## 2020-03-06 MED ORDER — FENTANYL CITRATE (PF) 100 MCG/2ML IJ SOLN
INTRAMUSCULAR | Status: AC
  Administered 2020-03-06: 25 ug via INTRAVENOUS
  Filled 2020-03-06: qty 2

## 2020-03-06 MED ORDER — ACETAMINOPHEN 650 MG RE SUPP: 650.0000 mg | RECTAL | Status: DC | PRN

## 2020-03-06 MED ORDER — LIDOCAINE-EPINEPHRINE 0.5 %-1:200000 IJ SOLN
INTRAMUSCULAR | Status: DC | PRN
  Administered 2020-03-06: 10 mL

## 2020-03-06 MED ORDER — ACETAMINOPHEN 325 MG PO TABS
650.0000 mg | ORAL_TABLET | ORAL | Status: DC | PRN
  Administered 2020-03-08: 650 mg via ORAL
  Filled 2020-03-06: qty 2

## 2020-03-06 MED ORDER — DEXAMETHASONE SODIUM PHOSPHATE 10 MG/ML IJ SOLN
INTRAMUSCULAR | Status: AC
  Filled 2020-03-06: qty 1

## 2020-03-06 MED ORDER — 0.9 % SODIUM CHLORIDE (POUR BTL) OPTIME
TOPICAL | Status: DC | PRN
  Administered 2020-03-06 (×3): 1000 mL

## 2020-03-06 MED ORDER — SODIUM CHLORIDE 0.9% FLUSH: 3.0000 mL | INTRAVENOUS | Status: DC | PRN

## 2020-03-06 MED ORDER — HYDROMORPHONE HCL 1 MG/ML IJ SOLN
1.0000 mg | INTRAMUSCULAR | Status: DC | PRN
  Administered 2020-03-06 – 2020-03-09 (×18): 1 mg via INTRAVENOUS
  Filled 2020-03-06 (×18): qty 1

## 2020-03-06 MED ORDER — MAGNESIUM OXIDE -MG SUPPLEMENT 250 MG PO TABS: 250.0000 mg | ORAL_TABLET | Freq: Every day | ORAL | Status: DC

## 2020-03-06 MED ORDER — SUGAMMADEX SODIUM 200 MG/2ML IV SOLN
INTRAVENOUS | Status: DC | PRN
  Administered 2020-03-06: 300 mg via INTRAVENOUS

## 2020-03-06 MED ORDER — LACTATED RINGERS IV SOLN: INTRAVENOUS | Status: DC | PRN

## 2020-03-06 MED ORDER — KETAMINE HCL 10 MG/ML IJ SOLN
INTRAMUSCULAR | Status: DC | PRN
  Administered 2020-03-06: 40 mg via INTRAVENOUS

## 2020-03-06 MED ORDER — MELATONIN 5 MG PO TABS
5.0000 mg | ORAL_TABLET | Freq: Every day | ORAL | Status: DC
  Administered 2020-03-06 – 2020-03-09 (×4): 5 mg via ORAL
  Filled 2020-03-06 (×4): qty 1

## 2020-03-06 MED ORDER — ONDANSETRON HCL 4 MG PO TABS: 4.0000 mg | ORAL_TABLET | Freq: Four times a day (QID) | ORAL | Status: DC | PRN

## 2020-03-06 MED ORDER — OXYCODONE HCL 5 MG/5ML PO SOLN: 5.0000 mg | Freq: Once | ORAL | Status: DC | PRN

## 2020-03-06 MED ORDER — KETOROLAC TROMETHAMINE 30 MG/ML IJ SOLN
INTRAMUSCULAR | Status: AC
  Filled 2020-03-06: qty 1

## 2020-03-06 MED ORDER — METFORMIN HCL ER 750 MG PO TB24
2000.0000 mg | ORAL_TABLET | Freq: Every day | ORAL | Status: DC
  Administered 2020-03-07 – 2020-03-10 (×4): 2000 mg via ORAL
  Filled 2020-03-06 (×4): qty 1

## 2020-03-06 MED ORDER — CEFAZOLIN SODIUM-DEXTROSE 2-4 GM/100ML-% IV SOLN
2.0000 g | INTRAVENOUS | Status: AC
  Administered 2020-03-06 (×2): 2 g via INTRAVENOUS
  Filled 2020-03-06: qty 100

## 2020-03-06 MED ORDER — LIDOCAINE-EPINEPHRINE 0.5 %-1:200000 IJ SOLN
INTRAMUSCULAR | Status: AC
  Filled 2020-03-06: qty 1

## 2020-03-06 MED ORDER — PROPOFOL 10 MG/ML IV BOLUS
INTRAVENOUS | Status: AC
  Filled 2020-03-06: qty 20

## 2020-03-06 MED ORDER — DIAZEPAM 5 MG PO TABS
5.0000 mg | ORAL_TABLET | Freq: Four times a day (QID) | ORAL | Status: DC | PRN
  Administered 2020-03-06 – 2020-03-10 (×11): 5 mg via ORAL
  Filled 2020-03-06 (×11): qty 1

## 2020-03-06 MED ORDER — ORAL CARE MOUTH RINSE: 15.0000 mL | Freq: Once | OROMUCOSAL | Status: AC

## 2020-03-06 MED ORDER — LIDOCAINE 2% (20 MG/ML) 5 ML SYRINGE
INTRAMUSCULAR | Status: DC | PRN
  Administered 2020-03-06: 60 mg via INTRAVENOUS

## 2020-03-06 MED ORDER — CEFAZOLIN SODIUM-DEXTROSE 2-4 GM/100ML-% IV SOLN
2.0000 g | Freq: Three times a day (TID) | INTRAVENOUS | Status: AC
  Administered 2020-03-06 – 2020-03-07 (×2): 2 g via INTRAVENOUS
  Filled 2020-03-06 (×2): qty 100

## 2020-03-06 MED ORDER — THROMBIN 20000 UNITS EX SOLR
CUTANEOUS | Status: DC | PRN
  Administered 2020-03-06: 20 mL via TOPICAL

## 2020-03-06 MED ORDER — DEXAMETHASONE SODIUM PHOSPHATE 10 MG/ML IJ SOLN
INTRAMUSCULAR | Status: DC | PRN
  Administered 2020-03-06: 10 mg via INTRAVENOUS

## 2020-03-06 MED ORDER — SENNA 8.6 MG PO TABS
1.0000 | ORAL_TABLET | Freq: Two times a day (BID) | ORAL | Status: DC
  Administered 2020-03-06 – 2020-03-10 (×8): 8.6 mg via ORAL
  Filled 2020-03-06 (×8): qty 1

## 2020-03-06 MED ORDER — BISACODYL 5 MG PO TBEC: 5.0000 mg | DELAYED_RELEASE_TABLET | Freq: Every day | ORAL | Status: DC | PRN

## 2020-03-06 MED ORDER — SCOPOLAMINE 1 MG/3DAYS TD PT72
MEDICATED_PATCH | TRANSDERMAL | Status: DC | PRN
  Administered 2020-03-06: 1 via TRANSDERMAL

## 2020-03-06 MED ORDER — SODIUM CHLORIDE 0.9 % IV SOLN: 250.0000 mL | INTRAVENOUS | Status: DC

## 2020-03-06 MED ORDER — THROMBIN 20000 UNITS EX SOLR
CUTANEOUS | Status: AC
  Filled 2020-03-06: qty 20000

## 2020-03-06 MED ORDER — FLUTICASONE PROPIONATE 50 MCG/ACT NA SUSP
2.0000 | Freq: Every day | NASAL | Status: DC
  Administered 2020-03-07 – 2020-03-10 (×4): 2 via NASAL
  Filled 2020-03-06: qty 16

## 2020-03-06 MED ORDER — BUPIVACAINE HCL (PF) 0.5 % IJ SOLN
INTRAMUSCULAR | Status: AC
  Filled 2020-03-06: qty 30

## 2020-03-06 MED ORDER — ONDANSETRON HCL 4 MG/2ML IJ SOLN
INTRAMUSCULAR | Status: AC
  Filled 2020-03-06: qty 2

## 2020-03-06 MED ORDER — MENTHOL 3 MG MT LOZG: 1.0000 | LOZENGE | OROMUCOSAL | Status: DC | PRN

## 2020-03-06 MED ORDER — HYDROMORPHONE HCL 1 MG/ML IJ SOLN
0.2500 mg | INTRAMUSCULAR | Status: DC | PRN
  Administered 2020-03-06 (×2): 0.5 mg via INTRAVENOUS

## 2020-03-06 MED ORDER — OXYCODONE HCL ER 15 MG PO T12A
60.0000 mg | EXTENDED_RELEASE_TABLET | Freq: Two times a day (BID) | ORAL | Status: DC
  Administered 2020-03-07 – 2020-03-10 (×7): 60 mg via ORAL
  Filled 2020-03-06 (×8): qty 4

## 2020-03-06 MED ORDER — LINACLOTIDE 145 MCG PO CAPS
290.0000 ug | ORAL_CAPSULE | Freq: Every day | ORAL | Status: DC
  Administered 2020-03-07 – 2020-03-10 (×4): 290 ug via ORAL
  Filled 2020-03-06 (×4): qty 2

## 2020-03-06 MED ORDER — PROPOFOL 500 MG/50ML IV EMUL
INTRAVENOUS | Status: DC | PRN
  Administered 2020-03-06: 25 ug/kg/min via INTRAVENOUS
  Administered 2020-03-06: 20 ug/kg/min via INTRAVENOUS

## 2020-03-06 MED ORDER — POTASSIUM CHLORIDE IN NACL 20-0.9 MEQ/L-% IV SOLN
INTRAVENOUS | Status: DC
  Filled 2020-03-06: qty 1000

## 2020-03-06 MED ORDER — ARTIFICIAL TEARS OPHTHALMIC OINT
TOPICAL_OINTMENT | OPHTHALMIC | Status: DC | PRN
  Administered 2020-03-06: 1 via OPHTHALMIC

## 2020-03-06 MED ORDER — ONDANSETRON HCL 4 MG/2ML IJ SOLN
4.0000 mg | Freq: Four times a day (QID) | INTRAMUSCULAR | Status: DC | PRN
  Administered 2020-03-07: 4 mg via INTRAVENOUS
  Filled 2020-03-06: qty 2

## 2020-03-06 MED ORDER — FENTANYL CITRATE (PF) 100 MCG/2ML IJ SOLN
25.0000 ug | INTRAMUSCULAR | Status: DC | PRN
  Administered 2020-03-06: 25 ug via INTRAVENOUS
  Administered 2020-03-06: 50 ug via INTRAVENOUS

## 2020-03-06 MED ORDER — PHENOL 1.4 % MT LIQD: 1.0000 | OROMUCOSAL | Status: DC | PRN

## 2020-03-06 MED ORDER — GLYCOPYRROLATE 1 MG PO TABS
2.0000 mg | ORAL_TABLET | Freq: Every day | ORAL | Status: DC
  Administered 2020-03-07 – 2020-03-10 (×4): 2 mg via ORAL
  Filled 2020-03-06 (×4): qty 2

## 2020-03-06 MED ORDER — CHLORHEXIDINE GLUCONATE 0.12 % MT SOLN
15.0000 mL | Freq: Once | OROMUCOSAL | Status: AC
  Administered 2020-03-06: 15 mL via OROMUCOSAL
  Filled 2020-03-06: qty 15

## 2020-03-06 MED ORDER — MAGNESIUM CITRATE PO SOLN: 1.0000 | Freq: Once | ORAL | Status: DC | PRN

## 2020-03-06 MED ORDER — SENNOSIDES-DOCUSATE SODIUM 8.6-50 MG PO TABS: 1.0000 | ORAL_TABLET | Freq: Every evening | ORAL | Status: DC | PRN

## 2020-03-06 MED ORDER — ZOLPIDEM TARTRATE 5 MG PO TABS: 5.0000 mg | ORAL_TABLET | Freq: Every evening | ORAL | Status: DC | PRN

## 2020-03-06 MED ORDER — POLYETHYLENE GLYCOL 3350 17 G PO PACK
17.0000 g | PACK | Freq: Every day | ORAL | Status: DC | PRN
  Administered 2020-03-09: 17 g via ORAL
  Filled 2020-03-06: qty 1

## 2020-03-06 MED ORDER — OXYCODONE HCL 5 MG PO TABS
5.0000 mg | ORAL_TABLET | ORAL | Status: DC | PRN
  Administered 2020-03-06 – 2020-03-07 (×3): 5 mg via ORAL
  Filled 2020-03-06 (×3): qty 1

## 2020-03-06 MED ORDER — LACTATED RINGERS IV SOLN: INTRAVENOUS | Status: DC

## 2020-03-06 MED ORDER — ONDANSETRON HCL 4 MG/2ML IJ SOLN
INTRAMUSCULAR | Status: DC | PRN
  Administered 2020-03-06: 4 mg via INTRAVENOUS

## 2020-03-06 MED ORDER — FENTANYL CITRATE (PF) 250 MCG/5ML IJ SOLN
INTRAMUSCULAR | Status: DC | PRN
Start: 2020-03-06 — End: 2020-03-06
  Administered 2020-03-06: 200 ug via INTRAVENOUS

## 2020-03-06 MED ORDER — INSULIN ASPART 100 UNIT/ML ~~LOC~~ SOLN
0.0000 [IU] | SUBCUTANEOUS | Status: DC
  Administered 2020-03-06: 3 [IU] via SUBCUTANEOUS
  Administered 2020-03-07: 4 [IU] via SUBCUTANEOUS
  Administered 2020-03-09: 3 [IU] via SUBCUTANEOUS

## 2020-03-06 MED ORDER — MIDAZOLAM HCL 2 MG/2ML IJ SOLN
INTRAMUSCULAR | Status: AC
  Filled 2020-03-06: qty 2

## 2020-03-06 MED ORDER — CELECOXIB 200 MG PO CAPS
200.0000 mg | ORAL_CAPSULE | Freq: Two times a day (BID) | ORAL | Status: DC
  Administered 2020-03-06 – 2020-03-10 (×8): 200 mg via ORAL
  Filled 2020-03-06 (×8): qty 1

## 2020-03-06 MED ORDER — FENTANYL CITRATE (PF) 250 MCG/5ML IJ SOLN
INTRAMUSCULAR | Status: AC
  Filled 2020-03-06: qty 5

## 2020-03-06 MED ORDER — PHENYLEPHRINE 40 MCG/ML (10ML) SYRINGE FOR IV PUSH (FOR BLOOD PRESSURE SUPPORT)
PREFILLED_SYRINGE | INTRAVENOUS | Status: DC | PRN
  Administered 2020-03-06: 40 ug via INTRAVENOUS
  Administered 2020-03-06 (×2): 120 ug via INTRAVENOUS

## 2020-03-06 MED ORDER — HEPARIN SODIUM (PORCINE) 5000 UNIT/ML IJ SOLN
5000.0000 [IU] | Freq: Three times a day (TID) | INTRAMUSCULAR | Status: DC
  Administered 2020-03-07 – 2020-03-10 (×10): 5000 [IU] via SUBCUTANEOUS
  Filled 2020-03-06 (×10): qty 1

## 2020-03-06 MED ORDER — ONDANSETRON HCL 4 MG PO TABS: 8.0000 mg | ORAL_TABLET | Freq: Every day | ORAL | Status: DC | PRN

## 2020-03-06 MED ORDER — ZOLPIDEM TARTRATE 5 MG PO TABS
5.0000 mg | ORAL_TABLET | Freq: Every day | ORAL | Status: DC
  Administered 2020-03-06 – 2020-03-09 (×4): 5 mg via ORAL
  Filled 2020-03-06 (×4): qty 1

## 2020-03-06 MED ORDER — CHLORHEXIDINE GLUCONATE CLOTH 2 % EX PADS
6.0000 | MEDICATED_PAD | Freq: Every day | CUTANEOUS | Status: DC
  Administered 2020-03-06 – 2020-03-10 (×5): 6 via TOPICAL

## 2020-03-06 MED ORDER — ROCURONIUM BROMIDE 10 MG/ML (PF) SYRINGE
PREFILLED_SYRINGE | INTRAVENOUS | Status: DC | PRN
  Administered 2020-03-06: 20 mg via INTRAVENOUS
  Administered 2020-03-06: 10 mg via INTRAVENOUS
  Administered 2020-03-06: 35 mg via INTRAVENOUS
  Administered 2020-03-06: 60 mg via INTRAVENOUS
  Administered 2020-03-06: 25 mg via INTRAVENOUS

## 2020-03-06 MED ORDER — ESTRADIOL 1 MG PO TABS
1.0000 mg | ORAL_TABLET | Freq: Every day | ORAL | Status: DC
  Administered 2020-03-07 – 2020-03-10 (×4): 1 mg via ORAL
  Filled 2020-03-06 (×4): qty 1

## 2020-03-06 MED ORDER — ROSUVASTATIN CALCIUM 20 MG PO TABS
40.0000 mg | ORAL_TABLET | Freq: Every day | ORAL | Status: DC
  Administered 2020-03-06 – 2020-03-09 (×4): 40 mg via ORAL
  Filled 2020-03-06 (×4): qty 2

## 2020-03-06 MED ORDER — ONDANSETRON HCL 4 MG/2ML IJ SOLN: 4.0000 mg | Freq: Four times a day (QID) | INTRAMUSCULAR | Status: DC | PRN

## 2020-03-06 SURGICAL SUPPLY — 72 items
BAG DECANTER FOR FLEXI CONT (MISCELLANEOUS) IMPLANT
BASKET BONE COLLECTION (BASKET) ×2 IMPLANT
BENZOIN TINCTURE PRP APPL 2/3 (GAUZE/BANDAGES/DRESSINGS) IMPLANT
BIT DRILL PLIF MAS DISP 5.5MM (DRILL) ×1 IMPLANT
BLADE CLIPPER SURG (BLADE) IMPLANT
BUR MATCHSTICK NEURO 3.0 LAGG (BURR) ×2 IMPLANT
BUR PRECISION FLUTE 5.0 (BURR) ×2 IMPLANT
CAGE CONVEX CASCADIA 8.5X22X10 (Cage) ×4 IMPLANT
CANISTER SUCT 3000ML PPV (MISCELLANEOUS) ×2 IMPLANT
CAP RELINE MOD TULIP RMM (Cap) ×12 IMPLANT
CARTRIDGE OIL MAESTRO DRILL (MISCELLANEOUS) ×1 IMPLANT
CASCADIA AN CONVEX 8.5X28X11 (Cage) ×4 IMPLANT
CNTNR URN SCR LID CUP LEK RST (MISCELLANEOUS) ×1 IMPLANT
CONT SPEC 4OZ STRL OR WHT (MISCELLANEOUS) ×1
COVER BACK TABLE 60X90IN (DRAPES) ×2 IMPLANT
COVER WAND RF STERILE (DRAPES) ×2 IMPLANT
DECANTER SPIKE VIAL GLASS SM (MISCELLANEOUS) ×2 IMPLANT
DERMABOND ADVANCED (GAUZE/BANDAGES/DRESSINGS) ×1
DERMABOND ADVANCED .7 DNX12 (GAUZE/BANDAGES/DRESSINGS) ×1 IMPLANT
DIFFUSER DRILL AIR PNEUMATIC (MISCELLANEOUS) ×2 IMPLANT
DRAPE C-ARM 42X72 X-RAY (DRAPES) ×2 IMPLANT
DRAPE C-ARMOR (DRAPES) ×2 IMPLANT
DRAPE LAPAROTOMY 100X72X124 (DRAPES) ×2 IMPLANT
DRAPE SURG 17X23 STRL (DRAPES) ×2 IMPLANT
DRILL PLIF MAS DISP 5.5MM (DRILL) ×2
DURAPREP 26ML APPLICATOR (WOUND CARE) ×2 IMPLANT
ELECT BLADE 4.0 EZ CLEAN MEGAD (MISCELLANEOUS) ×2
ELECT REM PT RETURN 9FT ADLT (ELECTROSURGICAL) ×2
ELECTRODE BLDE 4.0 EZ CLN MEGD (MISCELLANEOUS) ×1 IMPLANT
ELECTRODE REM PT RTRN 9FT ADLT (ELECTROSURGICAL) ×1 IMPLANT
GAUZE 4X4 16PLY RFD (DISPOSABLE) ×2 IMPLANT
GAUZE SPONGE 4X4 12PLY STRL (GAUZE/BANDAGES/DRESSINGS) IMPLANT
GLOVE BIOGEL PI IND STRL 6.5 (GLOVE) ×1 IMPLANT
GLOVE BIOGEL PI IND STRL 7.5 (GLOVE) ×2 IMPLANT
GLOVE BIOGEL PI INDICATOR 6.5 (GLOVE) ×1
GLOVE BIOGEL PI INDICATOR 7.5 (GLOVE) ×2
GLOVE ECLIPSE 6.5 STRL STRAW (GLOVE) ×4 IMPLANT
GLOVE ECLIPSE 7.0 STRL STRAW (GLOVE) ×2 IMPLANT
GLOVE EXAM NITRILE XL STR (GLOVE) IMPLANT
GLOVE SURG SS PI 6.0 STRL IVOR (GLOVE) ×6 IMPLANT
GLOVE SURG SS PI 7.0 STRL IVOR (GLOVE) ×6 IMPLANT
GOWN STRL REUS W/ TWL LRG LVL3 (GOWN DISPOSABLE) ×2 IMPLANT
GOWN STRL REUS W/ TWL XL LVL3 (GOWN DISPOSABLE) ×2 IMPLANT
GOWN STRL REUS W/TWL 2XL LVL3 (GOWN DISPOSABLE) IMPLANT
GOWN STRL REUS W/TWL LRG LVL3 (GOWN DISPOSABLE) ×2
GOWN STRL REUS W/TWL XL LVL3 (GOWN DISPOSABLE) ×2
INTERBODY CSCD AN CVX8.5X28X11 (Cage) ×2 IMPLANT
KIT BASIN OR (CUSTOM PROCEDURE TRAY) ×2 IMPLANT
KIT POSITION SURG JACKSON T1 (MISCELLANEOUS) ×2 IMPLANT
KIT TURNOVER KIT B (KITS) ×2 IMPLANT
MILL MEDIUM DISP (BLADE) ×2 IMPLANT
NEEDLE HYPO 25X1 1.5 SAFETY (NEEDLE) ×2 IMPLANT
NEEDLE SPNL 18GX3.5 QUINCKE PK (NEEDLE) ×2 IMPLANT
NS IRRIG 1000ML POUR BTL (IV SOLUTION) ×6 IMPLANT
OIL CARTRIDGE MAESTRO DRILL (MISCELLANEOUS) ×2
PACK LAMINECTOMY NEURO (CUSTOM PROCEDURE TRAY) ×2 IMPLANT
PAD ARMBOARD 7.5X6 YLW CONV (MISCELLANEOUS) IMPLANT
ROD RELINE-O COCR 5.0X55MM (Rod) ×4 IMPLANT
SCREW LOCK RSS 4.5/5.0MM (Screw) ×12 IMPLANT
SCREW SHANK RELINE MOD 5.5X35 (Screw) ×4 IMPLANT
SHANK RELINE MOD 5.5X40 (Screw) ×8 IMPLANT
SPONGE LAP 4X18 RFD (DISPOSABLE) IMPLANT
SPONGE SURGIFOAM ABS GEL 100 (HEMOSTASIS) ×2 IMPLANT
STRIP CLOSURE SKIN 1/2X4 (GAUZE/BANDAGES/DRESSINGS) IMPLANT
SUT PROLENE 6 0 BV (SUTURE) IMPLANT
SUT VIC AB 0 CT1 18XCR BRD8 (SUTURE) ×1 IMPLANT
SUT VIC AB 0 CT1 8-18 (SUTURE) ×1
SUT VIC AB 2-0 CT1 18 (SUTURE) ×2 IMPLANT
SUT VIC AB 3-0 SH 8-18 (SUTURE) ×2 IMPLANT
TOWEL GREEN STERILE (TOWEL DISPOSABLE) ×2 IMPLANT
TOWEL GREEN STERILE FF (TOWEL DISPOSABLE) ×2 IMPLANT
WATER STERILE IRR 1000ML POUR (IV SOLUTION) ×2 IMPLANT

## 2020-03-06 NOTE — Op Note (Signed)
03/06/2020  5:46 PM  PATIENT:  Kayla [name]  54 y.o. 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 POST-OPERATIVE DIAGNOSIS:  Spondylolisthesis, Lumbosacral region L4/5,5/S1 PROCEDURE:  Procedure(s): Lumbar four-fiveLumbar five Sacral one Posterior lumbar interbody fusion, Cascadia cages 62mm L5/S1,  37mm x73mm L4/5(Stryker), autograft morsels for the cages and in the disc spaces Laminectomy and inferior facetectomies of L4, and L5 beyond the needed exposure for Plif's at L4/5, and L5/S1 Segmental pedicle screw fixation L4-S1, nuvasive relign  SURGEON:  Surgeon(s): Coletta Memos, MD  ASSISTANTS:none  ANESTHESIA:   general  EBL:  Total I/O In: 2625 [I.V.:2400; Blood:225] Out: 805 [Urine:305; Blood:500]  BLOOD ADMINISTERED:250 CC CELLSAVER  CELL SAVER GIVEN:yes  COUNT:per nursing  DRAINS: none   SPECIMEN:  No Specimen  DICTATION: Kayla [name] is a 54 y.o. female whom was taken to the operating room intubated, and placed under a general anesthetic without difficulty. A foley catheter was placed under sterile conditions. She was positioned prone on a Jackson table with all pressure points properly padded.  Her lumbar region was prepped and draped in a sterile manner. I infiltrated 10cc's 1/2%lidocaine/1:2000,000 strength epinephrine into the planned incision. I opened the skin with a 10 blade and took the incision down to the thoracolumbar fascia. I exposed the lamina of L3,4,5,and S1 in a subperiosteal fashion bilaterally. I confirmed my location with an intraoperative xray.  I placed self retaining retractors and started the decompression.  I decompressed the spinal canal via complete laminectomies and inferior facetectomies of L4 and L5, coupled with partial superior facetectomies of L5, and S1. I used the drill and Kerrison punches to open the lateral recesses, and the  neural foramina. This was well beyond the needed exposure to perform a PLIF at either level.  PLIF's were performed at L4/5, and L5/S1 in the same fashion. I opened the disc spaces with a 15 blade then used a variety of instruments to remove the disc and prepare the spaces for the arthrodesis. I used curettes, rongeurs, punches, shavers for the disc space, and rasps in the discetomy. I measured the disc spaces and placed 6mm x32mm   Cages at 4/5, and 28mm x64mm  At L5/S1(Cascadia titanium) into the disc space(s).   I placed pedicle screws at L4,5,and S1, using fluoroscopic guidance. I drilled a pilot hole, then cannulated the pedicle with a drill at each site. I then tapped each pedicle, assessing each site for pedicle violations. No cutouts were appreciated. Screws [name] relign) were then placed at each site without difficulty. I attached rods and locking caps with the appropriate tools. The locking caps were secured with torque limited screwdrivers. Final films were performed and the final construct appeared to be in good position.  I closed the wound in a layered fashion. I approximated the thoracolumbar fascia, subcutaneous, and subcuticular planes with vicryl sutures. I used dermabond and an occlusive bandage for a sterile dressing.     PLAN OF CARE: Admit to inpatient   PATIENT DISPOSITION:  PACU - hemodynamically stable.   Delay start of Pharmacological VTE agent (>24hrs) due to surgical blood loss or risk of bleeding:  yes

## 2020-03-06 NOTE — H&P (Signed)
BP 114/72   Pulse 77   Temp 98.4 F (36.9 C) (Oral)   Resp 17   Ht 5\' 5"  (1.651 m)   Wt 90.7 kg   SpO2 96%   BMI 33.28 kg/m     Kayla [name] returns.  I have not seen her since 2019.  She had received lumbar epidural steroid injections, which were not of great utility.  She presents today with a 2-3 week history of severe pain which started on the left and is radiating through the buttocks, lateral thigh, calf, and into the foot.  There is some discomfort on the right, but nothing close to what is happening on the left.  She is barely able to sleep.  It awakens her at night repeatedly.  She finds this often with standing, having to lean over.  She finds taking stairs, going up and/or down, to be very difficult because of the pain in the left lower extremity.  She has some mild weakness on examination on the left, but I think that is mainly due to the pain.  She is 208 pounds.  Blood pressure is 124/82, pulse 76, temperature is 97.2. Pain is 5/10.  2+ reflexes at the knees and ankles bilaterally.     MEDICATIONS :  Today are as follows: Estradiol, Fluoxetine, Glycopyrrolate, Linzess, Lisinopril, Metformin, Montelukast, Omeprazole, Oxycodone 50 mg every 6, OxyContin 60 mg q.12, Rosuvastatin, and Valium, which is no longer taken.     ALLERGIES :  She is allergic to Daptomycin and Vancomycin.   Kayla [name] has also said that she has found herself with weakness in the dorsiflexors on the left side.  She is able heel walk today, with great discomfort, but is able to do it.  IMAGING :  CT was done.  What it shows is the pars defect that we knew was present at L5-S1.  It shows a grade 1 listhesis bordering almost on grade 2 at L5-S1.  Significant facet arthropathy at that level. Kayla [name] comes in today so we can go over the MRI of the lumbar spine.  What it shows is that she does have severe foraminal narrowing at L4-5 and stenosis at L4-5, and then she has a pars defect and  significant spondylolisthesis and foraminal narrowing at L5-S1.  I have recommended that we do a decompressive surgery at L4-5 and at L5-S1, and then proceed with an arthrodesis at both levels using pedicle screws and interbody devices.  We went over in great detail, using a model, and I gave her a detailed instruction sheet.     PHYSICAL EXAMINATION :  General:  She is alert and oriented by 4.  She answers all questions appropriately.  Memory, language, attention span, and fund of knowledge are normal.  HEENT:  Pupils equal, round, and react to light.  Full extraocular movements.

## 2020-03-06 NOTE — Progress Notes (Signed)
Orthopedic Tech Progress Note Patient Details:  Kayla [name] 06-Aug-1965 [id number] Called in order to [facility] for an ASPEN LUMBAR BRACE Patient ID: Lyla Son., female   DOB: 1965-11-13, 54 y.o.   MRN: [id number]   Donald Pore 03/06/2020, 2:28 PM

## 2020-03-06 NOTE — Anesthesia Postprocedure Evaluation (Signed)
Anesthesia Post Note  Patient: Kayla [name]  Procedure(s) Performed: Lumbar four-fiveLumbar five Sacral one Posterior lumbar interbody fusion (N/A )     Patient location during evaluation: PACU Anesthesia Type: General Level of consciousness: awake and alert Pain management: pain level controlled Vital Signs Assessment: post-procedure vital signs reviewed and stable Respiratory status: spontaneous breathing, nonlabored ventilation, respiratory function stable and patient connected to nasal cannula oxygen Cardiovascular status: blood pressure returned to baseline and stable Postop Assessment: no apparent nausea or vomiting Anesthetic complications: no   No complications documented.  Last Vitals:  Vitals:   03/06/20 1415 03/06/20 1430  BP: 110/84 105/82  Pulse: 88 82  Resp: 14 10  Temp:    SpO2: 100% 100%    Last Pain:  Vitals:   03/06/20 1415  TempSrc:   PainSc: 8                  [name]

## 2020-03-06 NOTE — Progress Notes (Signed)
Patient came to the unit very upset because she didn't get to talk with the surgeon after surgery. She said she didn't know how to move or walk and was upset about how much dilaudid she was getting and wanted a PCA pump. I called the PA on call and he said she wasn't getting a pump because that would slow her recovery time.

## 2020-03-06 NOTE — Anesthesia Preprocedure Evaluation (Signed)
Anesthesia Evaluation  Patient identified by MRN, date of birth, ID band Patient awake    Reviewed: Allergy & Precautions, H&P , NPO status , Patient's Chart, lab work & pertinent test results  Airway Mallampati: II   Neck ROM: full    Dental   Pulmonary former smoker,    breath sounds clear to auscultation       Cardiovascular hypertension,  Rhythm:regular Rate:Normal     Neuro/Psych PSYCHIATRIC DISORDERS Anxiety Depression    GI/Hepatic   Endo/Other  diabetes, Type 2obese  Renal/GU      Musculoskeletal  (+) Arthritis ,   Abdominal   Peds  Hematology   Anesthesia Other Findings   Reproductive/Obstetrics                             Anesthesia Physical Anesthesia Plan  ASA: II  Anesthesia Plan: General   Post-op Pain Management:    Induction: Intravenous  PONV Risk Score and Plan: 2 and Ondansetron, Dexamethasone, Midazolam and Treatment may vary due to age or medical condition  Airway Management Planned: Oral ETT  Additional Equipment:   Intra-op Plan:   Post-operative Plan: Extubation in OR  Informed Consent: I have reviewed the patients History and Physical, chart, labs and discussed the procedure including the risks, benefits and alternatives for the proposed anesthesia with the patient or authorized representative who has indicated his/her understanding and acceptance.       Plan Discussed with: CRNA, Anesthesiologist and Surgeon  Anesthesia Plan Comments:         Anesthesia Quick Evaluation

## 2020-03-06 NOTE — Anesthesia Procedure Notes (Signed)
Procedure Name: Intubation Date/Time: 03/06/2020 7:44 AM Performed by: Bryson Corona, CRNA Pre-anesthesia Checklist: Patient identified, Emergency Drugs available, Suction available and Patient being monitored Patient Re-evaluated:Patient Re-evaluated prior to induction Oxygen Delivery Method: Circle System Utilized Preoxygenation: Pre-oxygenation with 100% oxygen Induction Type: IV induction Ventilation: Mask ventilation without difficulty and Oral airway inserted - appropriate to patient size Laryngoscope Size: Mac and 3 Grade View: Grade I Tube type: Oral Tube size: 7.0 mm Number of attempts: 1 Airway Equipment and Method: Stylet and Oral airway Placement Confirmation: ETT inserted through vocal cords under direct vision,  positive ETCO2 and breath sounds checked- equal and bilateral Secured at: 22 cm Tube secured with: Tape Dental Injury: Teeth and Oropharynx as per pre-operative assessment

## 2020-03-06 NOTE — Transfer of Care (Signed)
Immediate Anesthesia Transfer of Care Note  Patient: Kayla [name]  Procedure(s) Performed: Lumbar four-fiveLumbar five Sacral one Posterior lumbar interbody fusion (N/A )  Patient Location: PACU  Anesthesia Type:General  Level of Consciousness: drowsy  Airway & Oxygen Therapy: Patient Spontanous Breathing and Patient connected to face mask oxygen  Post-op Assessment: Report given to RN and Post -op Vital signs reviewed and stable  Post vital signs: Reviewed and stable  Last Vitals:  Vitals Value Taken Time  BP 108/79 03/06/20 1347  Temp    Pulse 79 03/06/20 1349  Resp 14 03/06/20 1349  SpO2 100 % 03/06/20 1349  Vitals shown include unvalidated device data.  Last Pain:  Vitals:   03/06/20 0628  TempSrc:   PainSc: 6          Complications: No complications documented.

## 2020-03-07 LAB — GLUCOSE, CAPILLARY
Glucose-Capillary: 111 mg/dL — ABNORMAL HIGH (ref 70–99)
Glucose-Capillary: 115 mg/dL — ABNORMAL HIGH (ref 70–99)
Glucose-Capillary: 115 mg/dL — ABNORMAL HIGH (ref 70–99)
Glucose-Capillary: 131 mg/dL — ABNORMAL HIGH (ref 70–99)
Glucose-Capillary: 152 mg/dL — ABNORMAL HIGH (ref 70–99)
Glucose-Capillary: 91 mg/dL (ref 70–99)

## 2020-03-07 MED ORDER — OXYCODONE HCL 5 MG PO TABS
15.0000 mg | ORAL_TABLET | ORAL | Status: DC | PRN
  Administered 2020-03-07 – 2020-03-10 (×10): 15 mg via ORAL
  Filled 2020-03-07 (×10): qty 3

## 2020-03-07 MED ORDER — INFLUENZA VAC SPLIT QUAD 0.5 ML IM SUSY: 0.5000 mL | PREFILLED_SYRINGE | INTRAMUSCULAR | Status: DC

## 2020-03-07 NOTE — Progress Notes (Signed)
  NEUROSURGERY PROGRESS NOTE   No issues overnight.  Complains of appropriate back soreness Pre op pain appears to have resolved Does have some numbness in b/l feet, unsure if this is new  EXAM:  BP 103/70   Pulse 83   Temp 98.5 F (36.9 C)   Resp 19   Ht 5\' 5"  (1.651 m)   Wt 90.7 kg   SpO2 100%   BMI 33.28 kg/m   Awake, alert, oriented  Speech fluent, appropriate  CN grossly intact  MAEW, pain mediated weakness proximally Incision: some swelling superior aspect of incision without drainage.   IMPRESSION/PLAN 54 y.o. female POD1 L4-5 L5-S1 fusion. Doing fair - continue to progress through post operative phase.  - Therapy today - incisional swelling: No CSF leak noticed in OR. Denies sx consistent with CSF leak. ?hematoma. Continue to monitor.

## 2020-03-07 NOTE — Progress Notes (Signed)
Orthopedic Tech Progress Note Patient Details:  Kayla [name] 1965-11-29 [id number] RN called saying the LSO Brace from [facility] was broken. I applied a ned brace and took the old one back. Patient ID: Kayla Son., female   DOB: 1966/05/26, 54 y.o.   MRN: [id number]   Kayla [name] 03/07/2020, 9:26 AM

## 2020-03-07 NOTE — Progress Notes (Signed)
Spoke to NP on call about the patients left foot still being numb. The area on her back has not changed, possibly a hematoma. The patient is doing very well and has walked the unit and the long hallway almost every hour today.

## 2020-03-07 NOTE — Evaluation (Signed)
Occupational Therapy Evaluation Patient Details Name: Kayla [name]. MRN: [id number] DOB: Apr 15, 1966 Today's Date: 03/07/2020    History of Present Illness 54 y.o female s/p L4-S1 PLIF. No significant PMH on file.   Clinical Impression   PTA pt living with family and functioning at independent level. At time of eval, pt able to complete functional transfers and mobility at mod I level with RW. Pt demonstrated figure 4 method independently, and also states she has a long handled sponge and reacher for as needed at home. Back handout provided and reviewed adls in detail. Pt educated on: clothing between brace, never sleep in brace, set an alarm at night for medication, avoid sitting for long periods of time, correct bed positioning for sleeping, correct sequence for bed mobility, avoiding lifting more than 5 pounds and never wash directly over incision. All education is complete and patient indicates understanding. Son was also present for education. No further OT needs identified, OT will sign off. Thank you for this consult.    Follow Up Recommendations  No OT follow up    Equipment Recommendations  Tub/shower seat    Recommendations for Other Services       Precautions / Restrictions Precautions Precautions: Back Precaution Booklet Issued: Yes (comment) Precaution Comments: reviewed in context of ADL Required Braces or Orthoses: Spinal Brace Spinal Brace: Lumbar corset;Applied in sitting position Restrictions Weight Bearing Restrictions: No      Mobility Bed Mobility               General bed mobility comments: up OOB with PT  Transfers Overall transfer level: Needs assistance Equipment used: Rolling walker (2 wheeled) Transfers: Sit to/from Stand Sit to Stand: Supervision              Balance Overall balance assessment: No apparent balance deficits (not formally assessed)                                         ADL either  performed or assessed with clinical judgement   ADL                                         General ADL Comments: Pt demonstrates ability to complete ADL at mod I level. Pt demonstrated ability to complete figure 4 method for LB dressing/bathing, toilet transfer, and functional mobility without external assist. She reports she bought long handled items in anticipation for sx and research that she had done prior. Reviewed safe IADL routines as well.     Vision Patient Visual Report: No change from baseline       Perception     Praxis      Pertinent Vitals/Pain Pain Assessment: Faces Faces Pain Scale: Hurts a little bit Pain Location: lumbar incision Pain Descriptors / Indicators: Aching;Sore Pain Intervention(s): Monitored during session     Hand Dominance     Extremity/Trunk Assessment Upper Extremity Assessment Upper Extremity Assessment: Overall WFL for tasks assessed   Lower Extremity Assessment Lower Extremity Assessment: Defer to PT evaluation       Communication Communication Communication: No difficulties   Cognition Arousal/Alertness: Awake/alert Behavior During Therapy: WFL for tasks assessed/performed Overall Cognitive Status: Within Functional Limits for tasks assessed  General Comments       Exercises     Shoulder Instructions      Home Living Family/patient expects to be discharged to:: Private residence Living Arrangements: Children Available Help at Discharge: Family;Available PRN/intermittently Type of Home: House Home Access: Stairs to enter [employer] of Steps: 4 in back; full flight in front   Home Layout: Two level;Bed/bath upstairs Alternate Level Stairs-Number of Steps: full flight   Bathroom Shower/Tub: Chief Strategy Officer: Standard     Home Equipment: Mudlogger: Reacher;Long-handled sponge Additional  Comments: purchased long handled items in anticipation of sx      Prior Functioning/Environment Level of Independence: Independent                 OT Problem List: Decreased strength;Decreased knowledge of use of DME or AE;Decreased knowledge of precautions;Decreased activity tolerance;Impaired balance (sitting and/or standing);Pain      OT Treatment/Interventions:      OT Goals(Current goals can be found in the care plan section) Acute Rehab OT Goals Patient Stated Goal: return to independence OT Goal Formulation: All assessment and education complete, DC therapy  OT Frequency:     Barriers to D/C:            Co-evaluation              AM-PAC OT "6 Clicks" Daily Activity     Outcome Measure Help from another person eating meals?: None Help from another person taking care of personal grooming?: None Help from another person toileting, which includes using toliet, bedpan, or urinal?: None Help from another person bathing (including washing, rinsing, drying)?: None Help from another person to put on and taking off regular upper body clothing?: None Help from another person to put on and taking off regular lower body clothing?: None 6 Click Score: 24   End of Session Equipment Utilized During Treatment: Gait belt;Rolling walker  Activity Tolerance: Patient tolerated treatment well Patient left: Other (comment) (in hallway to practice stairs with PT)  OT Visit Diagnosis: Unsteadiness on feet (R26.81);Other abnormalities of gait and mobility (R26.89);Pain Pain - part of body:  (low back)                Time: 8841-6606 OT Time Calculation (min): 12 min Charges:  OT General Charges $OT Visit: 1 Visit OT Evaluation $OT Eval Low Complexity: 1 Low  Dalphine Handing, MSOT, OTR/L Acute Rehabilitation Services [facility] Office Number: [phone] Pager: [phone]  Dalphine Handing 03/07/2020, 10:03 AM

## 2020-03-07 NOTE — Evaluation (Signed)
Physical Therapy Evaluation Patient Details Name: Kayla [name]. MRN: [id number] DOB: [birth year] Today's Date: 03/07/2020   History of Present Illness  54 y.o female s/p L4-S1 PLIF. No significant PMH on file.  Clinical Impression  Patient is s/p above surgery resulting in the deficits listed below. PTA pt active and independent. On eval she required supervision transfers, supervision ambulation 250' with RW, and min guard assist ascend/descend 12 steps with L rail. Pt educated on 3/3 back precautions.  Patient will benefit from skilled PT to increase their independence and safety with mobility (while adhering to their precautions) to allow discharge home.     Follow Up Recommendations No PT follow up;Supervision for mobility/OOB    Equipment Recommendations  Rolling walker with 5" wheels    Recommendations for Other Services       Precautions / Restrictions Precautions Precautions: Back Precaution Booklet Issued: Yes (comment) Precaution Comments: educated on 3/3 back precautions, handout provided Required Braces or Orthoses: Spinal Brace Spinal Brace: Lumbar corset;Applied in sitting position Restrictions Weight Bearing Restrictions: No      Mobility  Bed Mobility               General bed mobility comments: OOB on arrival, verbally educated on logroll  Transfers Overall transfer level: Needs assistance Equipment used: Rolling walker (2 wheeled) Transfers: Sit to/from Stand Sit to Stand: Supervision         General transfer comment: increased time, pt demo safe technique  Ambulation/Gait Ambulation/Gait assistance: Supervision Gait Distance (Feet): 250 Feet Assistive device: Rolling walker (2 wheeled) Gait Pattern/deviations: Step-through pattern;Decreased stride length Gait velocity: decreased Gait velocity interpretation: 1.31 - 2.62 ft/sec, indicative of limited community ambulator General Gait Details: cues for posture, steady gait with  RW  Stairs Stairs: Yes Stairs assistance: Min guard Stair Management: One rail Left;Step to pattern;Forwards Number of Stairs: 12    Wheelchair Mobility    Modified Rankin (Stroke Patients Only)       Balance Overall balance assessment: No apparent balance deficits (not formally assessed)                                           Pertinent Vitals/Pain Pain Assessment: Faces Faces Pain Scale: Hurts even more Pain Location: back Pain Descriptors / Indicators: Grimacing;Guarding;Discomfort Pain Intervention(s): Repositioned;Patient requesting pain meds-RN notified    Home Living Family/patient expects to be discharged to:: Private residence Living Arrangements: Children Available Help at Discharge: Family;Available PRN/intermittently Type of Home: House Home Access: Stairs to enter Entrance Stairs-Rails: Right;Left Entrance Stairs-Number of Steps: 4 in back; full flight in front Home Layout: Two level;Bed/bath upstairs Home Equipment: Adaptive equipment;Toilet riser Additional Comments: purchased long handled items in anticipation of sx    Prior Function Level of Independence: Independent               Hand Dominance        Extremity/Trunk Assessment   Upper Extremity Assessment Upper Extremity Assessment: Overall WFL for tasks assessed    Lower Extremity Assessment Lower Extremity Assessment: LLE deficits/detail LLE Deficits / Details: Pt reports numbness L foot.    Cervical / Trunk Assessment Cervical / Trunk Assessment: Other exceptions Cervical / Trunk Exceptions: s/p PLIF  Communication   Communication: No difficulties  Cognition Arousal/Alertness: Awake/alert Behavior During Therapy: WFL for tasks assessed/performed Overall Cognitive Status: Within Functional Limits for tasks assessed  General Comments General comments (skin integrity, edema, etc.): Son present and  engaged in session.    Exercises     Assessment/Plan    PT Assessment Patient needs continued PT services  PT Problem List Decreased mobility;Decreased knowledge of precautions;Pain;Decreased knowledge of use of DME       PT Treatment Interventions DME instruction;Therapeutic activities;Gait training;Patient/family education;Stair training;Functional mobility training    PT Goals (Current goals can be found in the Care Plan section)  Acute Rehab PT Goals Patient Stated Goal: return to independence PT Goal Formulation: With patient Time For Goal Achievement: 03/14/20 Potential to Achieve Goals: Good    Frequency Min 5X/week   Barriers to discharge        Co-evaluation               AM-PAC PT "6 Clicks" Mobility  Outcome Measure Help needed turning from your back to your side while in a flat bed without using bedrails?: None Help needed moving from lying on your back to sitting on the side of a flat bed without using bedrails?: A Little Help needed moving to and from a bed to a chair (including a wheelchair)?: None Help needed standing up from a chair using your arms (e.g., wheelchair or bedside chair)?: None Help needed to walk in hospital room?: None Help needed climbing 3-5 steps with a railing? : A Little 6 Click Score: 22    End of Session Equipment Utilized During Treatment: Back brace Activity Tolerance: Patient tolerated treatment well Patient left: in chair;with call bell/phone within reach;with family/visitor present Nurse Communication: Mobility status;Patient requests pain meds PT Visit Diagnosis: Pain;Difficulty in walking, not elsewhere classified (R26.2)    Time: 4627-0350 PT Time Calculation (min) (ACUTE ONLY): 41 min   Charges:   PT Evaluation $PT Eval Moderate Complexity: 1 Mod PT Treatments $Gait Training: 8-22 mins        Kayla [name], PT  Office # [phone] Pager [phone]   Ilda Foil 03/07/2020, 10:46 AM

## 2020-03-07 NOTE — Plan of Care (Signed)

## 2020-03-08 LAB — GLUCOSE, CAPILLARY
Glucose-Capillary: 107 mg/dL — ABNORMAL HIGH (ref 70–99)
Glucose-Capillary: 110 mg/dL — ABNORMAL HIGH (ref 70–99)
Glucose-Capillary: 111 mg/dL — ABNORMAL HIGH (ref 70–99)
Glucose-Capillary: 112 mg/dL — ABNORMAL HIGH (ref 70–99)
Glucose-Capillary: 116 mg/dL — ABNORMAL HIGH (ref 70–99)
Glucose-Capillary: 93 mg/dL (ref 70–99)

## 2020-03-08 NOTE — Progress Notes (Signed)
Physical Therapy Treatment Patient Details Name: Kayla [name]. MRN: [id number] DOB: November 22, 1965 Today's Date: 03/08/2020    History of Present Illness 54 y.o female s/p L4-S1 PLIF. No significant PMH on file.    PT Comments    Pt continues with primary c/o numbness L foot/leg. She is progressing well with mobility. Supervision transfers and ambulation 500' with RW.    Follow Up Recommendations  No PT follow up;Supervision for mobility/OOB     Equipment Recommendations  Rolling walker with 5" wheels    Recommendations for Other Services       Precautions / Restrictions Precautions Precautions: Back Precaution Comments: reviewed 3/3 back precautions Required Braces or Orthoses: Spinal Brace Spinal Brace: Lumbar corset;Applied in sitting position    Mobility  Bed Mobility               General bed mobility comments: OOB on arrival  Transfers Overall transfer level: Needs assistance Equipment used: Rolling walker (2 wheeled) Transfers: Sit to/from Stand Sit to Stand: Supervision         General transfer comment: increased time, pt demo safe technique  Ambulation/Gait Ambulation/Gait assistance: Supervision Gait Distance (Feet): 500 Feet Assistive device: Rolling walker (2 wheeled) Gait Pattern/deviations: Step-through pattern;Decreased stride length Gait velocity: decreased Gait velocity interpretation: 1.31 - 2.62 ft/sec, indicative of limited community ambulator General Gait Details: steady gait with RW   Stairs             Wheelchair Mobility    Modified Rankin (Stroke Patients Only)       Balance Overall balance assessment: No apparent balance deficits (not formally assessed)                                          Cognition Arousal/Alertness: Awake/alert Behavior During Therapy: WFL for tasks assessed/performed Overall Cognitive Status: Within Functional Limits for tasks assessed                                         Exercises      General Comments        Pertinent Vitals/Pain Pain Assessment: 0-10 Pain Score: 8  Pain Location: back Pain Descriptors / Indicators: Grimacing;Guarding;Discomfort;Spasm Pain Intervention(s): RN gave pain meds during session;Repositioned;Monitored during session    Home Living                      Prior Function            PT Goals (current goals can now be found in the care plan section) Acute Rehab PT Goals Patient Stated Goal: return to independence Progress towards PT goals: Progressing toward goals    Frequency    Min 5X/week      PT Plan Current plan remains appropriate    Co-evaluation              AM-PAC PT "6 Clicks" Mobility   Outcome Measure  Help needed turning from your back to your side while in a flat bed without using bedrails?: None Help needed moving from lying on your back to sitting on the side of a flat bed without using bedrails?: A Little Help needed moving to and from a bed to a chair (including a wheelchair)?: None Help needed standing up from a chair using  your arms (e.g., wheelchair or bedside chair)?: None Help needed to walk in hospital room?: None Help needed climbing 3-5 steps with a railing? : A Little 6 Click Score: 22    End of Session Equipment Utilized During Treatment: Back brace Activity Tolerance: Patient tolerated treatment well Patient left: in chair;with call bell/phone within reach Nurse Communication: Mobility status PT Visit Diagnosis: Pain;Difficulty in walking, not elsewhere classified (R26.2)     Time: 7793-9030 PT Time Calculation (min) (ACUTE ONLY): 28 min  Charges:  $Gait Training: 23-37 mins                     Kayla [name], PT  Office # [phone] Pager [phone]    Ilda Foil 03/08/2020, 3:06 PM

## 2020-03-08 NOTE — Progress Notes (Signed)
  NEUROSURGERY PROGRESS NOTE   No issues overnight.  Up ambulating in halls when I arrived Complains of appropriate back soreness Left foot remains numb Believes she is progressing well, although does not feel ready for d/c  EXAM:  BP (!) 105/91 (BP Location: Left Arm)   Pulse 65   Temp 98.2 F (36.8 C) (Oral)   Resp 20   Ht 5\' 5"  (1.651 m)   Wt 90.7 kg   SpO2 100%   BMI 33.28 kg/m   Awake, alert, oriented  Speech fluent, appropriate  CN grossly intact  MAEW, pain mediated weakness proximally Incision: some swelling superior aspect of incision without drainage.   IMPRESSION/PLAN 54 y.o. female POD1 L4-5 L5-S1 fusion. Progressing well - continue to progress through post operative phase.  - worked with therapy yesterday. No f/u required. - incisional swelling: no change. Continue to monitor.

## 2020-03-09 LAB — GLUCOSE, CAPILLARY
Glucose-Capillary: 102 mg/dL — ABNORMAL HIGH (ref 70–99)
Glucose-Capillary: 110 mg/dL — ABNORMAL HIGH (ref 70–99)
Glucose-Capillary: 137 mg/dL — ABNORMAL HIGH (ref 70–99)
Glucose-Capillary: 77 mg/dL (ref 70–99)
Glucose-Capillary: 86 mg/dL (ref 70–99)
Glucose-Capillary: 98 mg/dL (ref 70–99)

## 2020-03-09 NOTE — Discharge Summary (Signed)
Physician Discharge Summary  Patient ID: Kayla [name]. MRN: [id number] DOB/AGE: 1966/04/19 54 y.o.  Admit date: 03/06/2020 Discharge date: 03/10/2020 Admission Diagnoses:Spondylolisthesis, Lumbosacral region L4/5,5/S1  Discharge Diagnoses: Spondylolisthesis, Lumbosacral region L4/5,5/S1 Active Problems:   Spondylolisthesis of lumbar region   Discharged Condition: good  Hospital Course: [name] was admitted and taken to the operating room for an uncomplicated lumbar decompression and arthrodesis. Post op she has ambulated, voided, and participated with physical and occupational therapy. Her wound is clean, dry, and without signs of infection at discharge. Home health treatment has been ordered for physical therapy  Treatments: surgery: Lumbar four-fiveLumbar five Sacral one Posterior lumbar interbody fusion, [place] cages 25m L5/S1,  [id number]/5([employer]), autograft morsels for the cages and in the disc spaces Laminectomy and inferior facetectomies of L4, and L5 beyond the needed exposure for Plif's at L4/5, and L5/S1 Segmental pedicle screw fixation L4-S1, nuvasive relign  Discharge Exam: Blood pressure 109/77, pulse 78, temperature 98.7 F (37.1 C), temperature source Oral, resp. rate 14, height 5' 5"  (1.651 m), weight 90.7 kg, SpO2 100 %. General appearance: alert, cooperative, appears stated age and mild distress  Disposition: Discharge disposition: 01-Home or Self Care      Spondylolisthesis, Lumbosacral region  Allergies as of 03/09/2020      Reactions   Daptomycin Other (See Comments)   Other reaction(s): INCREASES CPK Elevated CPK Other reaction(s): INCREASES CPK   Vancomycin Rash      Medication List    STOP taking these medications   ibuprofen 200 MG tablet Commonly known as: ADVIL     TAKE these medications   albuterol 108 (90 Base) MCG/ACT inhaler Commonly known as: VENTOLIN HFA Inhale into the lungs.   budesonide-formoterol  160-4.5 MCG/ACT inhaler Commonly known as: SYMBICORT Inhale 2 puffs into the lungs in the morning and at bedtime.   estradiol 2 MG tablet Commonly known as: ESTRACE Take 1 mg by mouth daily.   FLUoxetine 20 MG capsule Commonly known as: PROZAC Take 20 mg by mouth daily.   fluticasone 50 MCG/ACT nasal spray Commonly known as: FLONASE Place 2 sprays into both nostrils daily.   glucose blood test strip   glycopyrrolate 2 MG tablet Commonly known as: ROBINUL Take 2 mg by mouth daily.   Linzess 290 MCG Caps capsule Generic drug: linaclotide Take 290 mcg by mouth daily.   lisinopril 5 MG tablet Commonly known as: ZESTRIL Take 5 mg by mouth daily.   Magnesium Oxide -Mg Supplement 250 MG Tabs Take 250 mg by mouth daily.   Melatonin 5 MG Chew Chew 5 mg by mouth at bedtime. Gummy   metFORMIN 500 MG 24 hr tablet Commonly known as: GLUCOPHAGE-XR Take 2,000 mg by mouth daily.   montelukast 10 MG tablet Commonly known as: SINGULAIR Take 10 mg by mouth at bedtime as needed (allergies.).   ondansetron 8 MG tablet Commonly known as: ZOFRAN Take 8 mg by mouth daily as needed for nausea or vomiting.   ONE TOUCH ULTRA 2 w/Device Kit   ONE TOUCH ULTRA 2 w/Device Kit   oxyCODONE 15 MG immediate release tablet Commonly known as: ROXICODONE Take 15 mg by mouth 6 (six) times daily.   OxyCONTIN 60 MG 12 hr tablet Generic drug: oxyCODONE Take 60 mg by mouth 2 (two) times daily.   polyethylene glycol powder 17 GM/SCOOP powder Commonly known as: GLYCOLAX/MIRALAX Take 17 g by mouth daily as needed (constipation.).   rosuvastatin 40 MG tablet Commonly known as: CRESTOR Take 40  mg by mouth at bedtime.   zolpidem 5 MG tablet Commonly known as: AMBIEN Take 5 mg by mouth at bedtime.       Follow-up Information    Ashok Pall, MD Follow up in 3 week(s).   Specialty: Neurosurgery Why: please call the office to make an appointment Contact information: [address] [place] [address] [phone]               Signed: Ashok Pall 03/09/2020, 7:09 PM

## 2020-03-09 NOTE — Progress Notes (Signed)
Physical Therapy Treatment Patient Details Name: Kayla [name]. MRN: [id number] DOB: 1966/04/28 Today's Date: 03/09/2020    History of Present Illness 54 y.o female s/p L4-S1 PLIF. No significant PMH on file.    PT Comments    Pt up walking in halls mod I with RW. Pt with noted bilat LE edema, spoke with Colbert Coyer, PA who said he would order ted hose. Pt progressing well towards all goals and demo's good adherence to back precautions. Instructed on isometric core exercises, pt appreciative. Acute PT to cont to follow.    Follow Up Recommendations  Home health PT;Supervision/Assistance - 24 hour (to help progress off walker if pt and MD agrees)     Equipment Recommendations  Rolling walker with 5" wheels    Recommendations for Other Services       Precautions / Restrictions Precautions Precautions: Back Precaution Booklet Issued: Yes (comment) Precaution Comments: reviewed 3/3 back precautions Required Braces or Orthoses: Spinal Brace Spinal Brace: Lumbar corset;Applied in sitting position Restrictions Weight Bearing Restrictions: No    Mobility  Bed Mobility Overal bed mobility: Modified Independent             General bed mobility comments: went over log roll in/out of bed with bed flat, pt with good technique and was compliant with precautions  Transfers Overall transfer level: Needs assistance Equipment used: Rolling walker (2 wheeled) Transfers: Sit to/from Stand Sit to Stand: Supervision         General transfer comment: guarded, good hand placement, increased time  Ambulation/Gait Ambulation/Gait assistance: Supervision Gait Distance (Feet): 500 Feet Assistive device: Rolling walker (2 wheeled) Gait Pattern/deviations: Step-through pattern;Decreased stride length Gait velocity: dec Gait velocity interpretation: 1.31 - 2.62 ft/sec, indicative of limited community ambulator General Gait Details: slow, steady gait, pt with improved gait pattern  with RW and is able to tolerate longer distances with RW vs without, instructed on contracting core isometrically to assist with support   Stairs Stairs: Yes Stairs assistance: Min guard Stair Management: One rail Right;Step to pattern (HHA on L) Number of Stairs: 12 General stair comments: pt with good technique, decreased pace   Wheelchair Mobility    Modified Rankin (Stroke Patients Only)       Balance Overall balance assessment: No apparent balance deficits (not formally assessed)                                          Cognition Arousal/Alertness: Awake/alert Behavior During Therapy: WFL for tasks assessed/performed Overall Cognitive Status: Within Functional Limits for tasks assessed                                        Exercises Other Exercises Other Exercises: completed posterior pelvic tilts in hooklying, pt reports significant relief    General Comments General comments (skin integrity, edema, etc.): pt able to don/doff brace without assist, properlly. VSS      Pertinent Vitals/Pain Pain Assessment: 0-10 Pain Score: 9  Pain Location: back Pain Descriptors / Indicators: Spasm;Operative site guarding Pain Intervention(s): Monitored during session    Home Living                      Prior Function            PT Goals (current  goals can now be found in the care plan section) Progress towards PT goals: Progressing toward goals    Frequency    Min 5X/week      PT Plan Current plan remains appropriate;Discharge plan needs to be updated    Co-evaluation              AM-PAC PT "6 Clicks" Mobility   Outcome Measure  Help needed turning from your back to your side while in a flat bed without using bedrails?: None Help needed moving from lying on your back to sitting on the side of a flat bed without using bedrails?: None Help needed moving to and from a bed to a chair (including a wheelchair)?:  None Help needed standing up from a chair using your arms (e.g., wheelchair or bedside chair)?: None Help needed to walk in hospital room?: None Help needed climbing 3-5 steps with a railing? : A Little 6 Click Score: 23    End of Session Equipment Utilized During Treatment: Back brace Activity Tolerance: Patient tolerated treatment well Patient left: in chair;with call bell/phone within reach Nurse Communication: Mobility status PT Visit Diagnosis: Pain;Difficulty in walking, not elsewhere classified (R26.2)     Time: 3614-4315 PT Time Calculation (min) (ACUTE ONLY): 27 min  Charges:  $Gait Training: 8-22 mins $Therapeutic Activity: 8-22 mins                     Lewis Shock, PT, DPT Acute Rehabilitation Services Pager #: [phone] Office #: [phone]    [name] 03/09/2020, 8:28 AM

## 2020-03-09 NOTE — Progress Notes (Signed)
Patient ID: Lyla Son., female   DOB: 1965-11-21, 54 y.o.   MRN: [id number] BP 109/77 (BP Location: Left Arm)   Pulse 78   Temp 98.7 F (37.1 C) (Oral)   Resp 14   Ht 5\' 5"  (1.651 m)   Wt 90.7 kg   SpO2 100%   BMI 33.28 kg/m  Alert and oriented x 4, speech is clear and fluent Wound is clean, dry, without signs of infection. Discharge tomorrow

## 2020-03-09 NOTE — Discharge Instructions (Signed)

## 2020-03-10 ENCOUNTER — Ambulatory Visit: Payer: Medicare Other | Admitting: Family Medicine

## 2020-03-10 LAB — GLUCOSE, CAPILLARY
Glucose-Capillary: 105 mg/dL — ABNORMAL HIGH (ref 70–99)
Glucose-Capillary: 115 mg/dL — ABNORMAL HIGH (ref 70–99)
Glucose-Capillary: 122 mg/dL — ABNORMAL HIGH (ref 70–99)

## 2020-03-10 NOTE — TOC Transition Note (Signed)
Transition of Care (TOC) - CM/SW Discharge Note Donn Pierini RN,BSN Transitions of Care Unit 4NP (non trauma) - RN Case Manager See Treatment Team for direct Phone #   Patient Details  Name: Kayla [name]. MRN: [id number] Date of Birth: 12/24/1965  Transition of Care [facility]) CM/SW Contact:  Darrold Span, RN Phone Number: 03/10/2020, 3:02 PM   Clinical Narrative:    Pt stable for transition home today- orders placed for [facility] and DME needs- CM went to room to see pt however pt had just discharged home prior to CM being able to see pt at the bedside. Call made to home and msg left for pt to return call regarding transition of care needs.  1500- no return call yet from pt- call made to pt's home again- and patient answered- spoke with pt about transition needs- per pt she has RW, elevated toilet, grabber, etc- no DME needs noted. Discussed with pt order for HHPT services- pt states she is agreeable to referral- offered choice to pt per Medicare.gov- pt verbalized that she does not have a preference and defers to this Clinical research associate to find an agency that can provide needed service.  Address, phone # and PCP- all confirmed with pt- PCP per pt is actually- Caffie Damme with [facility].   Call made to Becky Sax with [facility] for [facility] PT referral- referral has been accepted with plan for them to see pt tomorrow to start [facility] services.   F/U done with pt to inform her of [facility] agency.    Final next level of care: Home w Home Health Services Barriers to Discharge: No Barriers Identified   Patient Goals and CMS Choice Patient states their goals for this hospitalization and ongoing recovery are:: return home CMS Medicare.gov Compare Post Acute Care list provided to:: Patient Choice offered to / list presented to : Patient  Discharge Placement                 Home with [facility]      Discharge Plan and Services   Discharge Planning Services: CM Consult Post Acute Care Choice: Durable  Medical Equipment, Home Health                    HH Arranged: PT [facility] Agency: [facility] Date [facility] Agency Contacted: 03/10/20 Time HH Agency Contacted: 1501 Representative spoke with at [facility] Agency: Becky Sax  Social Determinants of Health (SDOH) Interventions     Readmission Risk Interventions No flowsheet data found.

## 2020-03-10 NOTE — Progress Notes (Signed)
Patient d/c home with [facility]. Patient is stable and IV removed. Educated on discharge paperwork and all questions answered. Patient questioned having valium prescription outpatient. Spoke with Dr. Franky Macho stated he will send a muscle relaxer to her pharmacy but he does not prescribe valium outpatient. Informed patient she verbalized understanding. Patient son here transported patient and all belongings to car in a wheelchair without incident.

## 2020-03-10 NOTE — Care Management Important Message (Signed)
Important Message  Patient Details  Name: Kayla [name]. MRN: [id number] Date of Birth: 01-26-1966   Medicare Important Message Given:  Yes  Patient left prior to IM delivery.  Will send to the patient home address.    Reet Scharrer 03/10/2020, 3:50 PM

## 2020-03-10 NOTE — Progress Notes (Signed)
Physical Therapy Treatment Patient Details Name: Kayla [name]. MRN: [id number] DOB: 1965/07/13 Today's Date: 03/10/2020    History of Present Illness 54 y.o female s/p L4-S1 PLIF. No significant PMH on file.    PT Comments    Pt c/o of numbness in L LE especially in the bottom of the foot, causing instability with ambulation, ultimately requiring RW for safe ambulation. Pt continues with bilat LE edema as well. Continue to recommend HHPT to aide in progression of RW and L LE strengthening as able and allowed per MD. Acute PT to cont to follow.   Follow Up Recommendations  Home health PT;Supervision - Intermittent     Equipment Recommendations  Rolling walker with 5" wheels    Recommendations for Other Services       Precautions / Restrictions Precautions Precautions: Back Precaution Booklet Issued: Yes (comment) Precaution Comments: reviewed 3/3 back precautions Required Braces or Orthoses: Spinal Brace Spinal Brace: Lumbar corset;Applied in sitting position Restrictions Weight Bearing Restrictions: No    Mobility  Bed Mobility               General bed mobility comments: pt standing at sink upon PT arrival  Transfers Overall transfer level: Needs assistance Equipment used: None Transfers: Sit to/from Stand Sit to Stand: Supervision         General transfer comment: guarded, good hand position/pushing up from arm rests  Ambulation/Gait Ambulation/Gait assistance: Supervision Gait Distance (Feet): 500 Feet Assistive device: Rolling walker (2 wheeled);None Gait Pattern/deviations: Step-through pattern;Decreased stride length Gait velocity: dec Gait velocity interpretation: <1.31 ft/sec, indicative of household ambulator General Gait Details: pt amb 1/2 without RW and 1/2 with RW. Pt with significantly shorter step height and length with noted L LE instability causing pt to slide R foot along floor instead of pick up foot, pt also with progressive  trunk flexion with onset of fatigue when amb without AD. Pt with improved stability, more fluid gait pattern, and abilty to clear bilat feet with increased step height and length. with RW. Pt reports feeling more steady with RW   Stairs             Wheelchair Mobility    Modified Rankin (Stroke Patients Only)       Balance Overall balance assessment: No apparent balance deficits (not formally assessed)                                          Cognition Arousal/Alertness: Awake/alert Behavior During Therapy: WFL for tasks assessed/performed Overall Cognitive Status: Within Functional Limits for tasks assessed                                        Exercises      General Comments General comments (skin integrity, edema, etc.): pt donned brace in standing at sink      Pertinent Vitals/Pain Pain Assessment: 0-10 Pain Score: 8  Pain Location: back, bottom of L foot Pain Descriptors / Indicators: Spasm Pain Intervention(s): Monitored during session    Home Living                      Prior Function            PT Goals (current goals can now be found in the care  plan section) Progress towards PT goals: Progressing toward goals    Frequency    Min 5X/week      PT Plan Current plan remains appropriate;Discharge plan needs to be updated    Co-evaluation              AM-PAC PT "6 Clicks" Mobility   Outcome Measure  Help needed turning from your back to your side while in a flat bed without using bedrails?: None Help needed moving from lying on your back to sitting on the side of a flat bed without using bedrails?: None Help needed moving to and from a bed to a chair (including a wheelchair)?: None Help needed standing up from a chair using your arms (e.g., wheelchair or bedside chair)?: None Help needed to walk in hospital room?: None Help needed climbing 3-5 steps with a railing? : A Little 6 Click Score:  23    End of Session Equipment Utilized During Treatment: Back brace Activity Tolerance: Patient tolerated treatment well Patient left: in chair;with call bell/phone within reach Nurse Communication: Mobility status PT Visit Diagnosis: Pain;Difficulty in walking, not elsewhere classified (R26.2)     Time: 1856-3149 PT Time Calculation (min) (ACUTE ONLY): 16 min  Charges:  $Gait Training: 8-22 mins                     Lewis Shock, PT, DPT Acute Rehabilitation Services Pager #: [phone] Office #: [phone]    [name] 03/10/2020, 8:56 AM

## 2020-03-11 MED FILL — Sodium Chloride IV Soln 0.9%: INTRAVENOUS | Qty: 1000 | Status: AC

## 2020-03-11 MED FILL — Sodium Chloride Irrigation Soln 0.9%: Qty: 3000 | Status: AC

## 2020-03-11 MED FILL — Heparin Sodium (Porcine) Inj 1000 Unit/ML: INTRAMUSCULAR | Qty: 30 | Status: AC

## 2020-03-27 ENCOUNTER — Encounter (HOSPITAL_COMMUNITY): Payer: Self-pay | Admitting: Emergency Medicine

## 2020-03-27 ENCOUNTER — Other Ambulatory Visit: Payer: Self-pay

## 2020-03-27 ENCOUNTER — Inpatient Hospital Stay (HOSPITAL_COMMUNITY)
Admission: EM | Admit: 2020-03-27 | Discharge: 2020-03-30 | DRG: 921 | Disposition: A | Discharge status: Home / Self Care | Payer: Medicare Other | Attending: Neurosurgery | Admitting: Neurosurgery

## 2020-03-27 DIAGNOSIS — Z20822 Contact with and (suspected) exposure to covid-19: Secondary | ICD-10-CM | POA: Diagnosis present

## 2020-03-27 DIAGNOSIS — Z87891 Personal history of nicotine dependence: Secondary | ICD-10-CM | POA: Diagnosis not present

## 2020-03-27 DIAGNOSIS — Y838 Other surgical procedures as the cause of abnormal reaction of the patient, or of later complication, without mention of misadventure at the time of the procedure: Secondary | ICD-10-CM | POA: Diagnosis present

## 2020-03-27 DIAGNOSIS — E119 Type 2 diabetes mellitus without complications: Secondary | ICD-10-CM | POA: Diagnosis present

## 2020-03-27 DIAGNOSIS — L7634 Postprocedural seroma of skin and subcutaneous tissue following other procedure: Principal | ICD-10-CM | POA: Diagnosis present

## 2020-03-27 DIAGNOSIS — Z90710 Acquired absence of both cervix and uterus: Secondary | ICD-10-CM | POA: Diagnosis not present

## 2020-03-27 DIAGNOSIS — Z833 Family history of diabetes mellitus: Secondary | ICD-10-CM

## 2020-03-27 DIAGNOSIS — Z803 Family history of malignant neoplasm of breast: Secondary | ICD-10-CM

## 2020-03-27 DIAGNOSIS — Z981 Arthrodesis status: Secondary | ICD-10-CM

## 2020-03-27 DIAGNOSIS — Z823 Family history of stroke: Secondary | ICD-10-CM | POA: Diagnosis not present

## 2020-03-27 DIAGNOSIS — T148XXA Other injury of unspecified body region, initial encounter: Secondary | ICD-10-CM | POA: Diagnosis present

## 2020-03-27 DIAGNOSIS — L24A9 Irritant contact dermatitis due friction or contact with other specified body fluids: Secondary | ICD-10-CM | POA: Diagnosis present

## 2020-03-27 DIAGNOSIS — R519 Headache, unspecified: Secondary | ICD-10-CM | POA: Diagnosis present

## 2020-03-27 LAB — CBC WITH DIFFERENTIAL/PLATELET
Abs Immature Granulocytes: 0.03 10*3/uL (ref 0.00–0.07)
Basophils Absolute: 0.1 10*3/uL (ref 0.0–0.1)
Basophils Relative: 1 %
Eosinophils Absolute: 0.1 10*3/uL (ref 0.0–0.5)
Eosinophils Relative: 1 %
HCT: 33.5 % — ABNORMAL LOW (ref 36.0–46.0)
Hemoglobin: 10.9 g/dL — ABNORMAL LOW (ref 12.0–15.0)
Immature Granulocytes: 0 %
Lymphocytes Relative: 13 %
Lymphs Abs: 1.3 10*3/uL (ref 0.7–4.0)
MCH: 30.3 pg (ref 26.0–34.0)
MCHC: 32.5 g/dL (ref 30.0–36.0)
MCV: 93.1 fL (ref 80.0–100.0)
Monocytes Absolute: 0.8 10*3/uL (ref 0.1–1.0)
Monocytes Relative: 9 %
Neutro Abs: 7.5 10*3/uL (ref 1.7–7.7)
Neutrophils Relative %: 76 %
Platelets: 360 10*3/uL (ref 150–400)
RBC: 3.6 MIL/uL — ABNORMAL LOW (ref 3.87–5.11)
RDW: 13.1 % (ref 11.5–15.5)
WBC: 9.7 10*3/uL (ref 4.0–10.5)
nRBC: 0 % (ref 0.0–0.2)

## 2020-03-27 LAB — URINALYSIS, ROUTINE W REFLEX MICROSCOPIC
Bilirubin Urine: NEGATIVE
Glucose, UA: NEGATIVE mg/dL
Hgb urine dipstick: NEGATIVE
Ketones, ur: NEGATIVE mg/dL
Leukocytes,Ua: NEGATIVE
Nitrite: NEGATIVE
Protein, ur: NEGATIVE mg/dL
Specific Gravity, Urine: 1.009 (ref 1.005–1.030)
pH: 5 (ref 5.0–8.0)

## 2020-03-27 LAB — COMPREHENSIVE METABOLIC PANEL
ALT: 14 U/L (ref 0–44)
AST: 18 U/L (ref 15–41)
Albumin: 4 g/dL (ref 3.5–5.0)
Alkaline Phosphatase: 56 U/L (ref 38–126)
Anion gap: 12 (ref 5–15)
BUN: 13 mg/dL (ref 6–20)
CO2: 23 mmol/L (ref 22–32)
Calcium: 9.2 mg/dL (ref 8.9–10.3)
Chloride: 101 mmol/L (ref 98–111)
Creatinine, Ser: 0.76 mg/dL (ref 0.44–1.00)
GFR, Estimated: >60 mL/min (ref >60)
Glucose, Bld: 86 mg/dL (ref 70–99)
Potassium: 4.5 mmol/L (ref 3.5–5.1)
Sodium: 136 mmol/L (ref 135–145)
Total Bilirubin: 0.7 mg/dL (ref 0.3–1.2)
Total Protein: 6.9 g/dL (ref 6.5–8.1)

## 2020-03-27 NOTE — H&P (Signed)
Kayla [name] Kayla [name]. is an 54 y.o. female.   HPI:  54 year old female presents to the ED tonight after acute onset of severe back pain, fever, chills, and drainage from her incision. She had a two level lumbar fusion on October 1st by Dr. Franky Macho. She has done well until the last day or so. She did have her covid booster yesterday. States that it just started draining today but is not red and hot around her incision. Her pain is a 10/10, constant, achey and feels like her "bones are aching."   Past Medical History:  Diagnosis Date  . Anxiety   . Arthritis   . Chronic kidney disease    bladder numbness  . Depression   . Diabetes mellitus without complication [facility])     Past Surgical History:  Procedure Laterality Date  . ABDOMINAL HYSTERECTOMY    . APPENDECTOMY    . COLON SURGERY    . FOOT SURGERY      Allergies  Allergen Reactions  . Daptomycin Other (See Comments)    Other reaction(s): INCREASES CPK Elevated CPK Other reaction(s): INCREASES CPK   . Vancomycin Rash    Social History   Tobacco Use  . Smoking status: Former Games developer  . Smokeless tobacco: Never Used  Substance Use Topics  . Alcohol use: Never    Family History  Problem Relation Age of Onset  . Diabetes Mother   . Cancer Mother   . Breast cancer Mother   . Stroke Father      Review of Systems  Positive ROS: as above  All other systems have been reviewed and were otherwise negative with the exception of those mentioned in the HPI and as above.  Objective: Vital signs in last 24 hours: Temp:  [99.4 F (37.4 C)] 99.4 F (37.4 C) (10/22 2121) Pulse Rate:  [95] 95 (10/22 2121) Resp:  [18] 18 (10/22 2121) BP: (103)/(66) 103/66 (10/22 2121) SpO2:  [96 %] 96 % (10/22 2121) Weight:  [88 kg] 88 kg (10/22 2121)  General Appearance: Alert, cooperative, mild distress, appears stated age Back: Symmetric, no curvature, ROM normal, no CVA tenderness Lungs:  respirations unlabored Heart: Regular rate  and rhythm Extremities: Extremities normal, atraumatic, no cyanosis or edema Pulses: 2+ and symmetric all extremities Skin: Skin color, texture, turgor normal, no rashes or lesions, incision red and indurated  NEUROLOGIC:   Mental status: A&O x4, no aphasia, good attention span, Memory and fund of knowledge Motor Exam - grossly normal, normal tone and bulk Sensory Exam - grossly normal Reflexes: not tested  Coordination - not tested Gait - grossly normal Balance - grossly normal Cranial Nerves: I: smell Not tested  II: visual acuity  OS: na    OD: na  II: visual fields Full to confrontation  II: pupils Equal, round, reactive to light  III,VII: ptosis None  III,IV,VI: extraocular muscles  Full ROM  V: mastication   V: facial light touch sensation    V,VII: corneal reflex    VII: facial muscle function - upper    VII: facial muscle function - lower   VIII: hearing   IX: soft palate elevation    IX,X: gag reflex   XI: trapezius strength    XI: sternocleidomastoid strength   XI: neck flexion strength    XII: tongue strength      Data Review Lab Results  Component Value Date   WBC 12.3 (H) 03/06/2020   HGB 12.0 03/06/2020   HCT 37.3 03/06/2020  MCV 91.0 03/06/2020   PLT 259 03/06/2020   Lab Results  Component Value Date   NA 137 03/03/2020   K 4.3 03/03/2020   CL 105 03/03/2020   CO2 24 03/03/2020   BUN 14 03/03/2020   CREATININE 0.97 03/06/2020   GLUCOSE 118 (H) 03/03/2020   No results found for: INR, PROTIME  Radiology: No results found.  Assessment/Plan: 55 year old female presents to the ED tonight with complaints of drainage from her incision fever and chills. It does look like she has a possible wound infection. I would like to admit her to the hospital and place her on IV antibiotics. Will obtain a sed rate, CRP, and blood cultures. Will also get an MRI lumbar with and without contrast. Place on vancomycin, rifampin, and rocephin.    [name]  [name] 03/27/2020 10:40 PM

## 2020-03-27 NOTE — ED Triage Notes (Signed)
Pt st's she had spinal fusion 3 weeks ago  Today pt c/o severe headache and back pain onset this am.  Pt also st's she had her Covid Booster shot yesterday

## 2020-03-28 ENCOUNTER — Inpatient Hospital Stay (HOSPITAL_COMMUNITY): Payer: Medicare Other

## 2020-03-28 ENCOUNTER — Other Ambulatory Visit: Payer: Self-pay

## 2020-03-28 LAB — RESPIRATORY PANEL BY RT PCR (FLU A&B, COVID)
Influenza A by PCR: NEGATIVE
Influenza B by PCR: NEGATIVE
SARS Coronavirus 2 by RT PCR: NEGATIVE

## 2020-03-28 LAB — CBG MONITORING, ED: Glucose-Capillary: 87 mg/dL (ref 70–99)

## 2020-03-28 LAB — GLUCOSE, CAPILLARY
Glucose-Capillary: 95 mg/dL (ref 70–99)
Glucose-Capillary: 99 mg/dL (ref 70–99)

## 2020-03-28 LAB — SEDIMENTATION RATE: Sed Rate: 36 mm/hr — ABNORMAL HIGH (ref 0–22)

## 2020-03-28 LAB — C-REACTIVE PROTEIN: CRP: 1.8 mg/dL — ABNORMAL HIGH (ref <1.0)

## 2020-03-28 IMAGING — MR MR LUMBAR SPINE WO/W CM
5 of 9 series · 28 of 48 positions shown · IV contrast (gadavist)
Comparison: [DATE]

CLINICAL DATA: Low back pain. Fever and chills. Recent lumbar
fusion

EXAM:
MRI LUMBAR SPINE WITHOUT AND WITH CONTRAST
TECHNIQUE: Multiplanar and multiecho pulse sequences of the lumbar spine were
obtained without and with intravenous contrast.
CONTRAST:  9mL GADAVIST GADOBUTROL 1 MMOL/ML IV SOLN

[Series 5: T2 · sagittal · 4.0mm · 0.73mm/px · 5 of 18 slices shown (1 of 3)]
[im 1/18]
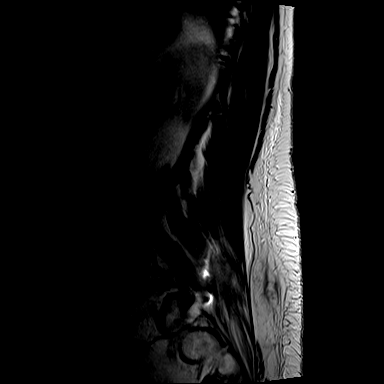
[im 5/18]
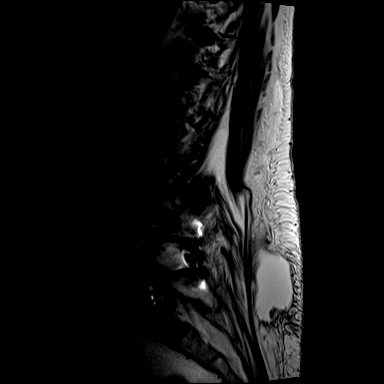
[im 9/18]
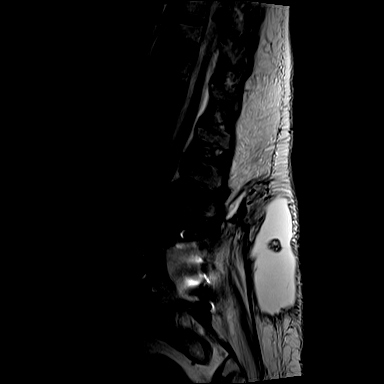
[im 13/18]
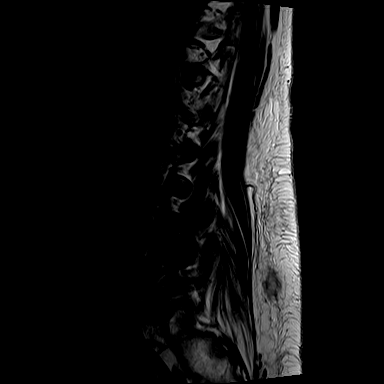
[im 18/18]
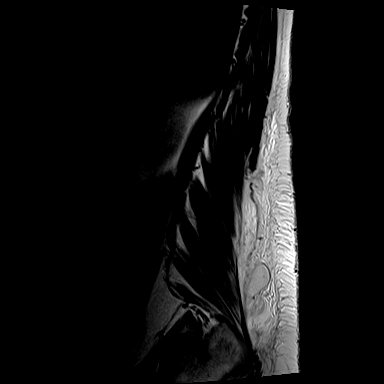

[Series 7: T1 · sagittal · 4.0mm · 0.88mm/px · 4 of 18 slices shown (1 of 2)]
[im 1/18]
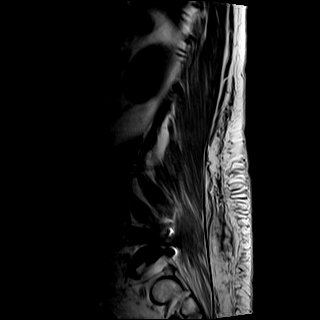
[im 6/18]
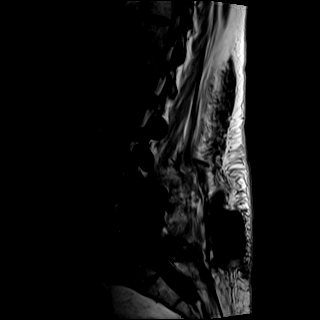
[im 12/18]
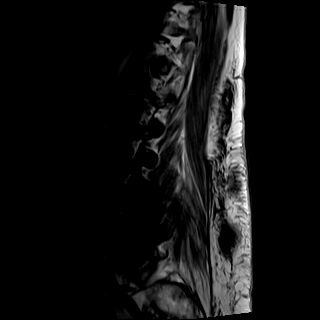
[im 18/18]
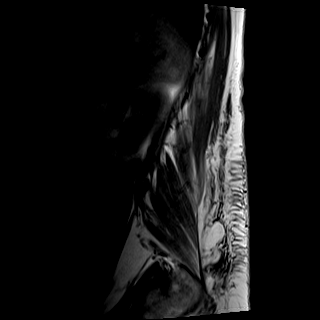

[Series 9: T2 · axial · 4.0mm · 0.57mm/px · z∈[-111,+90]mm · 7 of 34 slices shown (2 of 3)]
[im 1/34]
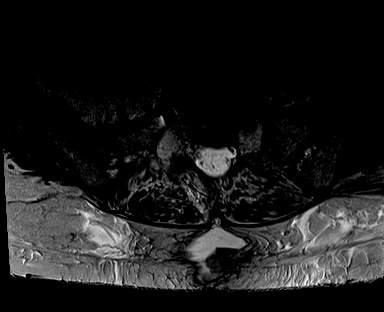
[im 6/34]
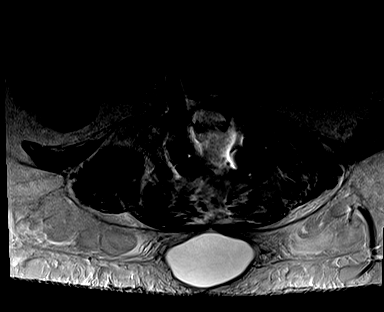
[im 12/34]
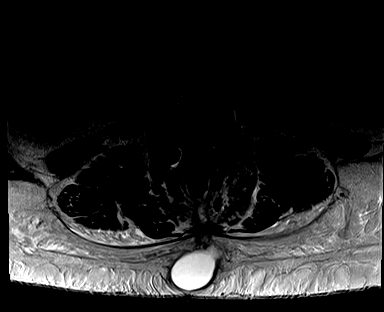
[im 17/34]
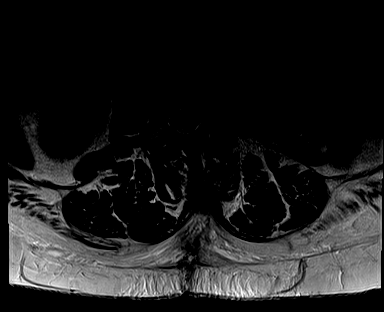
[im 23/34]
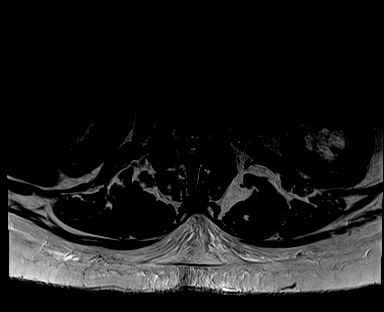
[im 28/34]
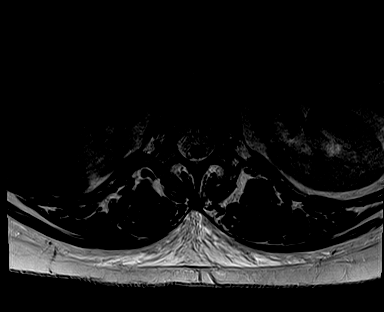
[im 34/34]
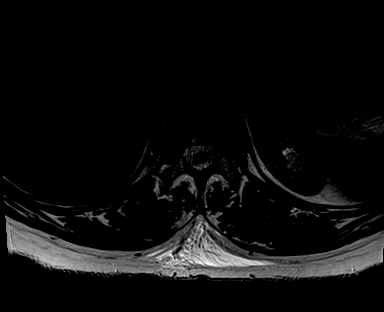

[Series 10: T1 · axial · 4.0mm · 0.34mm/px · z∈[-111,+61]mm · 6 of 34 slices shown (2 of 2)]
[im 1/34]
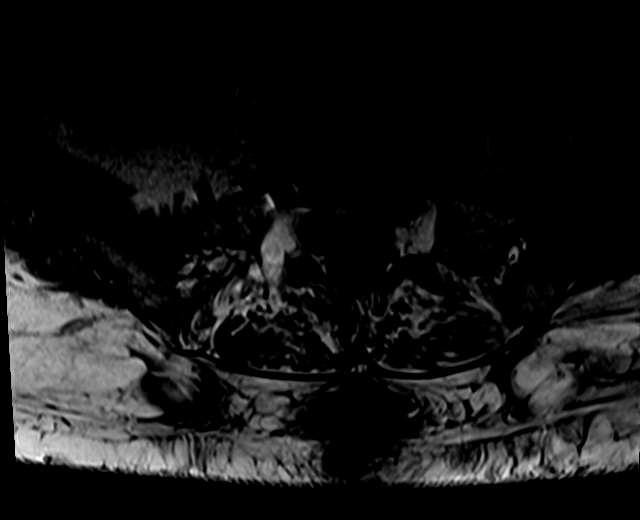
[im 6/34]
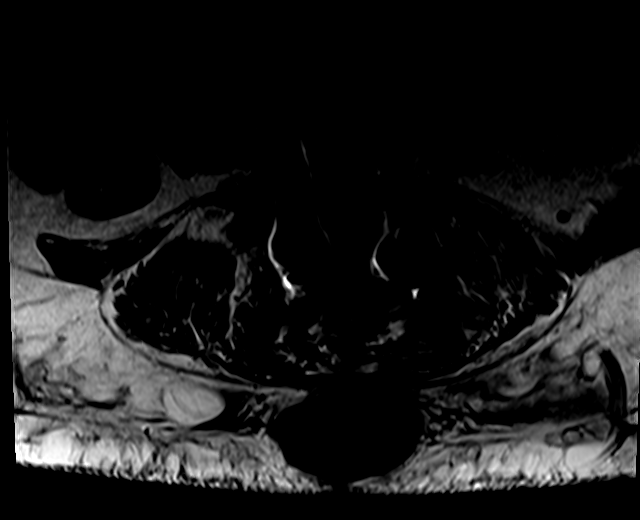
[im 12/34]
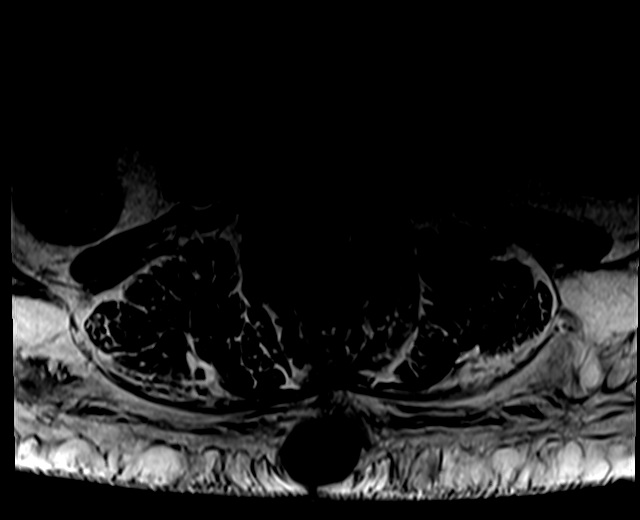
[im 17/34]
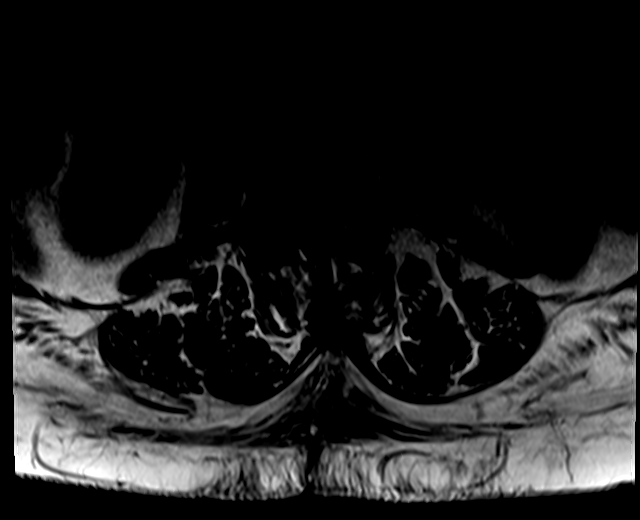
[im 23/34]
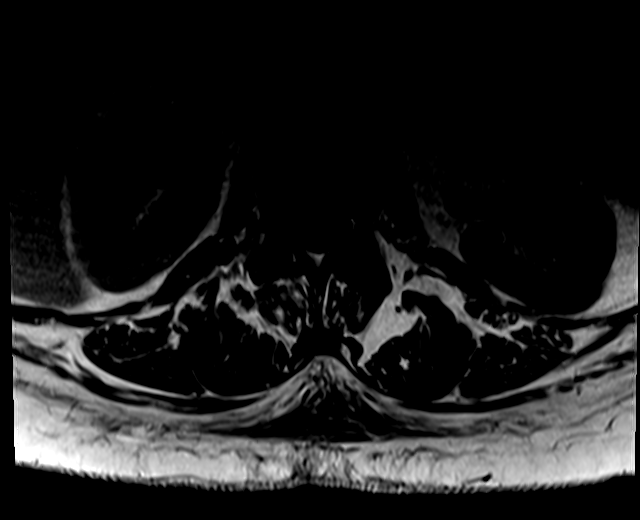
[im 28/34]
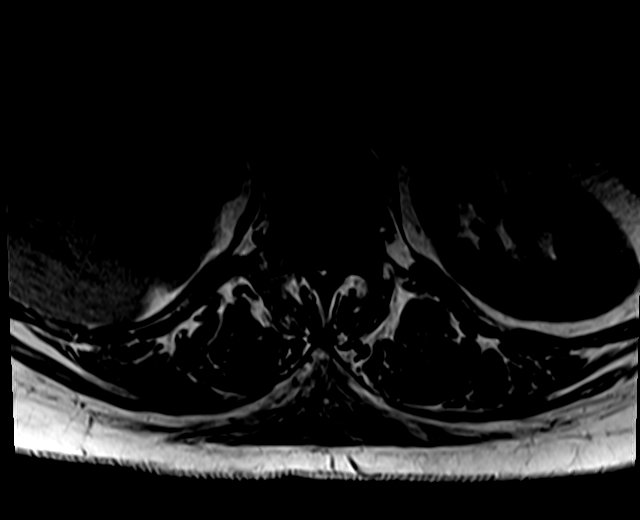

[Series 1024: T2 · coronal · 4.5mm · 0.49mm/px · 6 of 29 slices shown (3 of 3)]
[im 1/29]
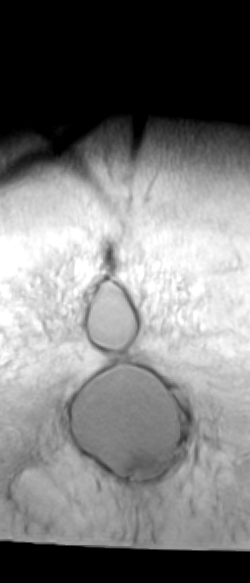
[im 6/29]
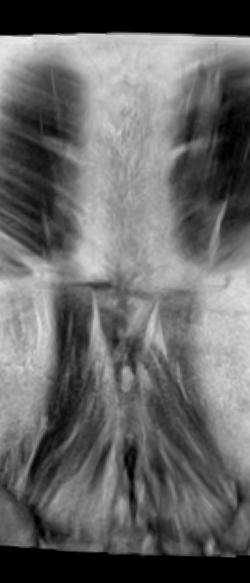
[im 12/29]
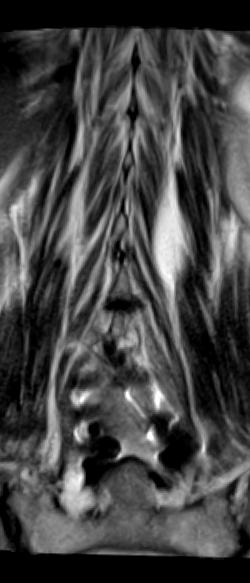
[im 17/29]
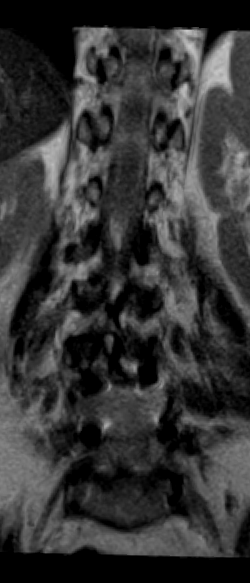
[im 23/29]
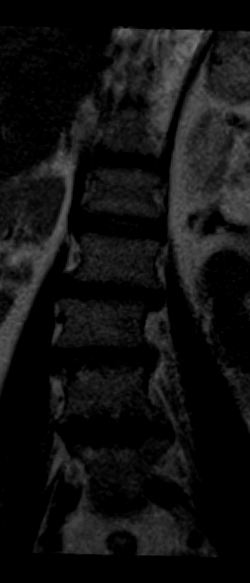
[im 29/29]
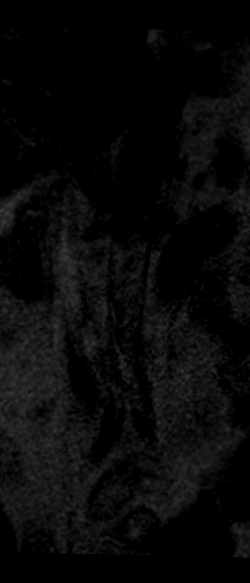

[28 of 48 positions shown; findings below may reference images not displayed]

FINDINGS: Segmentation:  Standard

Alignment:  Grade 1 anterolisthesis at L5-S1, unchanged

Vertebrae: L4-S1 PLIF. Posterior decompression. No evidence of
discitis-osteomyelitis. No fracture.

Conus medullaris and cauda equina: Conus extends to the L2 level.
There is mild contrast enhancement of the cauda equina nerve roots.
No clumping.

Paraspinal and other soft tissues: Dorsal postoperative fluid
collection measures 2.9 x 7.8 cm.

Disc levels:

L1-L2: Normal disc space and facet joints. No spinal canal stenosis.
No neural foraminal stenosis.

L2-L3: Unchanged left asymmetric disc bulge. Left lateral recess
narrowing without central spinal canal stenosis. No neural foraminal
stenosis.

L3-L4: Small disc bulge. Progression of mild spinal canal stenosis.
No neural foraminal stenosis.

L4-L5: Postsurgical changes. No spinal canal stenosis. No neural
foraminal stenosis.

L5-S1: Postsurgical changes. No spinal canal stenosis. No neural
foraminal stenosis.

Visualized sacrum: Normal.
IMPRESSION: 1. Mild contrast enhancement of the cauda equina nerve roots without
clumping or nodularity. This is nonspecific but could indicate a
mild arachnoiditis.
2. Dorsal postoperative fluid collection measuring 2.9 x 7.8 cm. No
specific features of infection.
3. Mild spinal canal stenosis at L3-L4, slightly worsened.

## 2020-03-28 MED ORDER — ALBUTEROL SULFATE HFA 108 (90 BASE) MCG/ACT IN AERS
1.0000 | INHALATION_SPRAY | Freq: Four times a day (QID) | RESPIRATORY_TRACT | Status: DC | PRN
  Filled 2020-03-28: qty 6.7

## 2020-03-28 MED ORDER — LINACLOTIDE 145 MCG PO CAPS
290.0000 ug | ORAL_CAPSULE | Freq: Every day | ORAL | Status: DC
  Administered 2020-03-28 – 2020-03-30 (×3): 290 ug via ORAL
  Filled 2020-03-28 (×4): qty 2

## 2020-03-28 MED ORDER — SENNA 8.6 MG PO TABS
1.0000 | ORAL_TABLET | Freq: Two times a day (BID) | ORAL | Status: DC
  Administered 2020-03-28 – 2020-03-29 (×3): 8.6 mg via ORAL
  Filled 2020-03-28 (×5): qty 1

## 2020-03-28 MED ORDER — RIFAMPIN 300 MG PO CAPS
600.0000 mg | ORAL_CAPSULE | Freq: Every day | ORAL | Status: DC
  Administered 2020-03-28: 600 mg via ORAL
  Filled 2020-03-28: qty 2

## 2020-03-28 MED ORDER — GADOBUTROL 1 MMOL/ML IV SOLN
9.0000 mL | Freq: Once | INTRAVENOUS | Status: AC | PRN
  Administered 2020-03-28: 9 mL via INTRAVENOUS

## 2020-03-28 MED ORDER — OXYCODONE HCL 5 MG PO TABS
15.0000 mg | ORAL_TABLET | Freq: Every day | ORAL | Status: DC
  Administered 2020-03-28 – 2020-03-30 (×15): 15 mg via ORAL
  Filled 2020-03-28 (×15): qty 3

## 2020-03-28 MED ORDER — VANCOMYCIN HCL 1500 MG/300ML IV SOLN
1500.0000 mg | Freq: Once | INTRAVENOUS | Status: AC
  Administered 2020-03-28: 1500 mg via INTRAVENOUS
  Filled 2020-03-28: qty 300

## 2020-03-28 MED ORDER — METFORMIN HCL ER 750 MG PO TB24
2000.0000 mg | ORAL_TABLET | Freq: Every day | ORAL | Status: DC
  Administered 2020-03-28 – 2020-03-29 (×2): 2000 mg via ORAL
  Filled 2020-03-28 (×4): qty 1

## 2020-03-28 MED ORDER — SODIUM CHLORIDE 0.9 % IV SOLN
2.0000 g | INTRAVENOUS | Status: DC
  Administered 2020-03-28: 2 g via INTRAVENOUS
  Filled 2020-03-28: qty 20

## 2020-03-28 MED ORDER — MAGNESIUM OXIDE 400 (241.3 MG) MG PO TABS
400.0000 mg | ORAL_TABLET | Freq: Every day | ORAL | Status: DC
  Administered 2020-03-28 – 2020-03-30 (×3): 400 mg via ORAL
  Filled 2020-03-28 (×3): qty 1

## 2020-03-28 MED ORDER — SODIUM CHLORIDE 0.9 % IV SOLN
250.0000 mL | INTRAVENOUS | Status: DC
  Administered 2020-03-28: 250 mL via INTRAVENOUS

## 2020-03-28 MED ORDER — FLUOXETINE HCL 20 MG PO CAPS
20.0000 mg | ORAL_CAPSULE | Freq: Every day | ORAL | Status: DC
  Administered 2020-03-28 – 2020-03-30 (×3): 20 mg via ORAL
  Filled 2020-03-28 (×3): qty 1

## 2020-03-28 MED ORDER — SODIUM CHLORIDE 0.9% FLUSH: 3.0000 mL | INTRAVENOUS | Status: DC | PRN

## 2020-03-28 MED ORDER — ACETAMINOPHEN 650 MG RE SUPP: 650.0000 mg | RECTAL | Status: DC | PRN

## 2020-03-28 MED ORDER — ONDANSETRON HCL 4 MG PO TABS
4.0000 mg | ORAL_TABLET | Freq: Four times a day (QID) | ORAL | Status: DC | PRN
  Administered 2020-03-28: 4 mg via ORAL
  Filled 2020-03-28 (×2): qty 1

## 2020-03-28 MED ORDER — ONDANSETRON HCL 4 MG/2ML IJ SOLN
4.0000 mg | Freq: Four times a day (QID) | INTRAMUSCULAR | Status: DC | PRN
  Administered 2020-03-28 – 2020-03-30 (×2): 4 mg via INTRAVENOUS
  Filled 2020-03-28 (×2): qty 2

## 2020-03-28 MED ORDER — MONTELUKAST SODIUM 10 MG PO TABS
10.0000 mg | ORAL_TABLET | Freq: Every evening | ORAL | Status: DC | PRN
  Filled 2020-03-28: qty 1

## 2020-03-28 MED ORDER — INSULIN ASPART 100 UNIT/ML ~~LOC~~ SOLN
0.0000 [IU] | Freq: Three times a day (TID) | SUBCUTANEOUS | Status: DC
  Administered 2020-03-29: 2 [IU] via SUBCUTANEOUS

## 2020-03-28 MED ORDER — DOXYCYCLINE HYCLATE 100 MG PO TABS
100.0000 mg | ORAL_TABLET | Freq: Two times a day (BID) | ORAL | Status: DC
  Administered 2020-03-28 – 2020-03-30 (×5): 100 mg via ORAL
  Filled 2020-03-28 (×5): qty 1

## 2020-03-28 MED ORDER — ESTRADIOL 1 MG PO TABS
1.0000 mg | ORAL_TABLET | Freq: Every day | ORAL | Status: DC
  Administered 2020-03-28 – 2020-03-30 (×3): 1 mg via ORAL
  Filled 2020-03-28 (×3): qty 1

## 2020-03-28 MED ORDER — LISINOPRIL 5 MG PO TABS
5.0000 mg | ORAL_TABLET | Freq: Every day | ORAL | Status: DC
  Administered 2020-03-29 – 2020-03-30 (×2): 5 mg via ORAL
  Filled 2020-03-28 (×3): qty 1

## 2020-03-28 MED ORDER — SODIUM CHLORIDE 0.9% FLUSH
3.0000 mL | Freq: Two times a day (BID) | INTRAVENOUS | Status: DC
  Administered 2020-03-28 – 2020-03-30 (×4): 3 mL via INTRAVENOUS

## 2020-03-28 MED ORDER — POTASSIUM CHLORIDE IN NACL 20-0.9 MEQ/L-% IV SOLN
INTRAVENOUS | Status: DC
  Filled 2020-03-28 (×3): qty 1000

## 2020-03-28 MED ORDER — GLYCOPYRROLATE 1 MG PO TABS
2.0000 mg | ORAL_TABLET | Freq: Every day | ORAL | Status: DC
  Administered 2020-03-28 – 2020-03-30 (×3): 2 mg via ORAL
  Filled 2020-03-28 (×3): qty 2

## 2020-03-28 MED ORDER — OXYCODONE HCL ER 10 MG PO T12A
60.0000 mg | EXTENDED_RELEASE_TABLET | Freq: Two times a day (BID) | ORAL | Status: DC
  Administered 2020-03-28 – 2020-03-30 (×5): 60 mg via ORAL
  Filled 2020-03-28 (×6): qty 6

## 2020-03-28 MED ORDER — LORAZEPAM 2 MG/ML IJ SOLN
1.0000 mg | Freq: Once | INTRAMUSCULAR | Status: AC
  Administered 2020-03-28: 1 mg via INTRAVENOUS
  Filled 2020-03-28: qty 1

## 2020-03-28 MED ORDER — MOMETASONE FURO-FORMOTEROL FUM 200-5 MCG/ACT IN AERO
2.0000 | INHALATION_SPRAY | Freq: Two times a day (BID) | RESPIRATORY_TRACT | Status: DC
  Administered 2020-03-28 – 2020-03-30 (×5): 2 via RESPIRATORY_TRACT
  Filled 2020-03-28: qty 8.8

## 2020-03-28 MED ORDER — MORPHINE SULFATE (PF) 4 MG/ML IV SOLN
4.0000 mg | Freq: Once | INTRAVENOUS | Status: AC
  Administered 2020-03-28: 4 mg via INTRAVENOUS
  Filled 2020-03-28: qty 1

## 2020-03-28 MED ORDER — ACETAMINOPHEN 325 MG PO TABS
650.0000 mg | ORAL_TABLET | ORAL | Status: DC | PRN
  Administered 2020-03-28 (×2): 650 mg via ORAL
  Filled 2020-03-28 (×2): qty 2

## 2020-03-28 MED ORDER — DIPHENHYDRAMINE HCL 50 MG/ML IJ SOLN
50.0000 mg | Freq: Once | INTRAMUSCULAR | Status: AC
  Administered 2020-03-28: 50 mg via INTRAVENOUS
  Filled 2020-03-28: qty 1

## 2020-03-28 MED ORDER — VANCOMYCIN HCL 1250 MG/250ML IV SOLN: 1250.0000 mg | Freq: Two times a day (BID) | INTRAVENOUS | Status: DC

## 2020-03-28 NOTE — ED Notes (Signed)
Pt called out stating she feels like she is having a reaction to the vancomycin. Staff immediatly came in. Pt arms red and chest is red, pt states she feels very hot. Face is red and swelling under eyes noted. Vancomycin stopped and MD paged. . Denies SOB, endorses being dizzy  93/61 P: 99 SpO2 99% RR 30

## 2020-03-28 NOTE — ED Notes (Signed)
Pt requesting to ambulate, pt states decreased pain when standing. Pt denies dizziness when ambulating. Pt assisted to the restroom.

## 2020-03-28 NOTE — Progress Notes (Addendum)
Pharmacy Antibiotic Note  Kayla [name] is a 54 y.o. female s/p lumbar fusion 10/1 admitted on 03/27/2020 with back pain and possible wound infection .  Pharmacy has been consulted for Vancomycin  Dosing.  NP discussed allergy/reaction with patient and would like to use Vancomycin   Plan: Vancomycin 1500 mg IV now, then 1250 mg IV q12h   Height: 5\' 4"  (162.6 cm) Weight: 88 kg (194 lb) IBW/kg (Calculated) : 54.7  Temp (24hrs), Avg:99.4 F (37.4 C), Min:99.4 F (37.4 C), Max:99.4 F (37.4 C)  Recent Labs  Lab 03/27/20 2210  WBC 9.7  CREATININE 0.76    Estimated Creatinine Clearance: 84.3 mL/min (by C-G formula based on SCr of 0.76 mg/dL).    Allergies  Allergen Reactions  . Daptomycin Other (See Comments)    Other reaction(s): INCREASES CPK Elevated CPK Other reaction(s): INCREASES CPK   . Vancomycin Rash    03/29/20 03/28/2020 12:29 AM

## 2020-03-28 NOTE — Progress Notes (Signed)
NEUROSURGERY PROGRESS NOTE  Still complaining of feeling "really bad" and severe back pain. Her sed rate and CRP are mildly elevated but does not look overtly infection. MRI lumbar spine shows a superficial fluid collection but again does not appear to be infection from what I can tell. She did break out in hives when she received vancomycin so the nurse stopped this. I think that we can likely treat this with oral doxycycline for now and keep an eye on her wound. Will stop IV abx. Her wound is still red and indurated but not draining anything this morning. Placed order for diet  Temp:  [99 F (37.2 C)-99.4 F (37.4 C)] 99 F (37.2 C) (10/23 0434) Pulse Rate:  [75-95] 79 (10/23 0615) Resp:  [18-22] 21 (10/23 0615) BP: (91-103)/(61-69) 91/61 (10/23 0637) SpO2:  [96 %-99 %] 96 % (10/23 0615) Weight:  [88 kg] 88 kg (10/22 2121)   Sherryl Manges, NP 03/28/2020 8:47 AM

## 2020-03-28 NOTE — ED Notes (Signed)
Spoke to Provider. Instructed to give 50mg  Benadryl and discontinue Vanc. Pt is currently NPO until MD can reassess and determine if pt will be going back to surgery.

## 2020-03-28 NOTE — ED Notes (Signed)
Pt c/o nausea, refuses zofran, states "it doesn't work".  Requesting promethazine

## 2020-03-28 NOTE — ED Notes (Signed)
Confirmed with Neurosurgery about the order for Vancomycin; given the pts allergy to the medication. Meyran, NP confirmed medication order to be given.

## 2020-03-29 LAB — GLUCOSE, CAPILLARY
Glucose-Capillary: 95 mg/dL (ref 70–99)
Glucose-Capillary: 99 mg/dL (ref 70–99)
Glucose-Capillary: 142 mg/dL — ABNORMAL HIGH (ref 70–99)
Glucose-Capillary: 70 mg/dL (ref 70–99)

## 2020-03-29 NOTE — Progress Notes (Signed)
   03/29/20 0026  Provider Notification  Provider Name/Title NP Rosamaria Lints  Date Provider Notified 03/29/20  Time Provider Notified [phone]  Notification Type Page  Notification Reason Other (Comment) (pt's BP read 81/56)  Response Other (Comment) (no new orders at this time)  Date of Provider Response 03/29/20  Time of Provider Response [phone]

## 2020-03-29 NOTE — Progress Notes (Signed)
Pt seen by Dr Yetta Barre and suggested pt be made independent as he needs her to ambulate, this RN tried to suggest that pt can ambulate with a staff due to her BP being soft, but he insisted pt can walk by herself. Obasogie-Asidi, Kayla [name]

## 2020-03-29 NOTE — Progress Notes (Signed)
Patient ID: Kayla Son., female   DOB: February 07, 1966, 54 y.o.   MRN: [id number] Subjective: Patient reports increased redness extending over to the left flank, no drainage, she does complain of a positional headache.  Objective: Vital signs in last 24 hours: Temp:  [97.6 F (36.4 C)-99.1 F (37.3 C)] 98.3 F (36.8 C) (10/24 0356) Pulse Rate:  [63-82] 74 (10/24 0356) Resp:  [12-20] 17 (10/24 0356) BP: (83-98)/(51-70) 91/55 (10/24 0356) SpO2:  [93 %-100 %] 100 % (10/24 0356)  Intake/Output from previous day: 10/23 0701 - 10/24 0700 In: 1737.7 [I.V.:1587.7; IV Piggyback:150] Out: -  Intake/Output this shift: Total I/O In: 1587.7 [I.V.:1587.7] Out: -   Neurologic: Grossly normal, her incision is clean and dry and intact and less indurated but still some mild erythema and the erythema now extends to the left flank, there is no drainage and the incision does not look grossly infected or significantly swollen, he is awake and alert and pleasant with good strength  Lab Results: Lab Results  Component Value Date   WBC 9.7 03/27/2020   HGB 10.9 (L) 03/27/2020   HCT 33.5 (L) 03/27/2020   MCV 93.1 03/27/2020   PLT 360 03/27/2020   No results found for: INR, PROTIME BMET Lab Results  Component Value Date   NA 136 03/27/2020   K 4.5 03/27/2020   CL 101 03/27/2020   CO2 23 03/27/2020   GLUCOSE 86 03/27/2020   BUN 13 03/27/2020   CREATININE 0.76 03/27/2020   CALCIUM 9.2 03/27/2020    Studies/Results: MR Lumbar Spine W Wo Contrast  Result Date: 03/28/2020 CLINICAL DATA:  Low back pain. Fever and chills. Recent lumbar fusion EXAM: MRI LUMBAR SPINE WITHOUT AND WITH CONTRAST TECHNIQUE: Multiplanar and multiecho pulse sequences of the lumbar spine were obtained without and with intravenous contrast. CONTRAST:  53mL GADAVIST GADOBUTROL 1 MMOL/ML IV SOLN COMPARISON:  02/05/2020 FINDINGS: Segmentation:  Standard Alignment:  Grade 1 anterolisthesis at L5-S1, unchanged Vertebrae:  L4-S1 PLIF. Posterior decompression. No evidence of discitis-osteomyelitis. No fracture. Conus medullaris and cauda equina: Conus extends to the L2 level. There is mild contrast enhancement of the cauda equina nerve roots. No clumping. Paraspinal and other soft tissues: Dorsal postoperative fluid collection measures 2.9 x 7.8 cm. Disc levels: L1-L2: Normal disc space and facet joints. No spinal canal stenosis. No neural foraminal stenosis. L2-L3: Unchanged left asymmetric disc bulge. Left lateral recess narrowing without central spinal canal stenosis. No neural foraminal stenosis. L3-L4: Small disc bulge. Progression of mild spinal canal stenosis. No neural foraminal stenosis. L4-L5: Postsurgical changes. No spinal canal stenosis. No neural foraminal stenosis. L5-S1: Postsurgical changes. No spinal canal stenosis. No neural foraminal stenosis. Visualized sacrum: Normal. IMPRESSION: 1. Mild contrast enhancement of the cauda equina nerve roots without clumping or nodularity. 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 2. Dorsal postoperative fluid collection measuring 2.9 x 7.8 cm. No specific features of infection. 3. Mild spinal canal stenosis at L3-L4, slightly worsened. Electronically Signed   By: Deatra Robinson M.D.   On: 03/28/2020 02:57    Assessment/Plan: I prepped her skin with Betadine and using a 21-gauge needle withdrew about 26 cc of serosanguineous fluid that did not look like spinal fluid nor did it look infected.  She does have positional headaches but again this did not look like spinal fluid  Continue doxycycline for what appears to be some cellulitis.  Her sed rate and CRP were mildly elevated but really not enough to suggest a deep wound  infection  Dr. Franky Macho to see tomorrow  Estimated body mass index is 33.3 kg/m as calculated from the following:   Height as of this encounter: 5\' 4"  (1.626 m).   Weight as of this encounter: 88 kg.    LOS: 2 days    03/29/2020, 6:48 AM

## 2020-03-29 NOTE — Progress Notes (Signed)
BP is 83/51

## 2020-03-30 LAB — GLUCOSE, CAPILLARY
Glucose-Capillary: 101 mg/dL — ABNORMAL HIGH (ref 70–99)
Glucose-Capillary: 101 mg/dL — ABNORMAL HIGH (ref 70–99)

## 2020-03-30 MED ORDER — DOXYCYCLINE HYCLATE 100 MG PO TABS
100.0000 mg | ORAL_TABLET | Freq: Two times a day (BID) | ORAL | 0 refills | Status: DC
Start: 2020-03-30 — End: 2021-01-26

## 2020-03-30 NOTE — Discharge Summary (Signed)
Physician Discharge Summary  Patient ID: Kayla [name]. MRN: [id number] DOB/AGE: Jun 23, 1965 54 y.o.  Admit date: 03/27/2020 Discharge date: 03/30/2020  Admission Diagnoses:wound infection  Discharge Diagnoses: wound seroma Active Problems:   Wound drainage   Discharged Condition: good  Hospital Course: Kayla [name] is a 54 y.o. female Admitted for wound drainage. The wound was aspirated and found not to be purulent. Oral antibiotics were administered. She is improved at discharge, though the wound remains indurated. She is ambulating, voiding, and tolerating a regular diet. The wound is dry at discharge.   Treatments: as Above  Discharge Exam: Blood pressure 121/89, pulse 82, temperature 98.2 F (36.8 C), temperature source Oral, resp. rate 18, height 5' 4"  (1.626 m), weight 88 kg, SpO2 100 %. General appearance: alert, cooperative, appears stated age and no distress  Disposition: Discharge disposition: 01-Home or Self Care      * No surgery found *  Allergies as of 03/30/2020      Reactions   Daptomycin Other (See Comments)   Other reaction(s): INCREASES CPK Elevated CPK Other reaction(s): INCREASES CPK   Vancomycin Rash      Medication List    TAKE these medications   acetaminophen 500 MG tablet Commonly known as: TYLENOL Take 1,000 mg by mouth every 8 (eight) hours as needed for moderate pain or fever.   albuterol 108 (90 Base) MCG/ACT inhaler Commonly known as: VENTOLIN HFA Inhale into the lungs.   budesonide-formoterol 160-4.5 MCG/ACT inhaler Commonly known as: SYMBICORT Inhale 2 puffs into the lungs in the morning and at bedtime.   doxycycline 100 MG tablet Commonly known as: VIBRA-TABS Take 1 tablet (100 mg total) by mouth 2 (two) times daily.   estradiol 2 MG tablet Commonly known as: ESTRACE Take 1 mg by mouth daily.   FLUoxetine 20 MG capsule Commonly known as: PROZAC Take 20 mg by mouth daily.   fluticasone 50  MCG/ACT nasal spray Commonly known as: FLONASE Place 2 sprays into both nostrils daily.   glucose blood test strip   glycopyrrolate 2 MG tablet Commonly known as: ROBINUL Take 2 mg by mouth daily.   Linzess 290 MCG Caps capsule Generic drug: linaclotide Take 290 mcg by mouth daily.   lisinopril 5 MG tablet Commonly known as: ZESTRIL Take 5 mg by mouth daily.   Magnesium Oxide -Mg Supplement 250 MG Tabs Take 250 mg by mouth daily.   Melatonin 5 MG Chew Chew 5 mg by mouth at bedtime. Gummy   metFORMIN 500 MG 24 hr tablet Commonly known as: GLUCOPHAGE-XR Take 2,000 mg by mouth daily.   montelukast 10 MG tablet Commonly known as: SINGULAIR Take 10 mg by mouth at bedtime as needed (allergies.).   ondansetron 8 MG tablet Commonly known as: ZOFRAN Take 8 mg by mouth daily as needed for nausea or vomiting.   ONE TOUCH ULTRA 2 w/Device Kit   ONE TOUCH ULTRA 2 w/Device Kit   oxyCODONE 15 MG immediate release tablet Commonly known as: ROXICODONE Take 15 mg by mouth 6 (six) times daily.   OxyCONTIN 60 MG 12 hr tablet Generic drug: oxyCODONE Take 60 mg by mouth 2 (two) times daily.   polyethylene glycol powder 17 GM/SCOOP powder Commonly known as: GLYCOLAX/MIRALAX Take 17 g by mouth daily as needed (constipation.).   rosuvastatin 40 MG tablet Commonly known as: CRESTOR Take 40 mg by mouth at bedtime.   tiZANidine 4 MG tablet Commonly known as: ZANAFLEX Take 2 mg by mouth at bedtime  as needed for muscle spasms.   zolpidem 5 MG tablet Commonly known as: AMBIEN Take 5 mg by mouth at bedtime.       Follow-up Information    Ashok Pall, MD Follow up in 2 week(s).   Specialty: Neurosurgery Why: please call the office to make an appointment Contact information: [address] [place] [address] [phone]               Signed: Ashok Pall 03/30/2020, 6:33 PM

## 2020-03-30 NOTE — Progress Notes (Signed)
Called Dr. Sueanne Margarita office regarding patient awaiting to be seen for discharge. Received call back that he would be by this evening. Patient made aware.

## 2020-03-30 NOTE — Progress Notes (Signed)
Patient discharged to home. VS stable, PIV removed. Discharge instructions explained to patient, she had no further questions. One prescription and all patients belongings were sent with her.

## 2020-03-30 NOTE — Care Management Important Message (Signed)
Important Message  Patient Details  Name: Kayla [name]. MRN: [id number] Date of Birth: September 20, 1965   Medicare Important Message Given:  Yes     Gayleen Sholtz 03/30/2020, 3:35 PM

## 2020-03-30 NOTE — Discharge Instructions (Signed)
If the wound continues to drain please call the office.  If you have a temperature greater than 101.5 call the office

## 2020-04-02 LAB — CULTURE, BLOOD (ROUTINE X 2)
Culture: NO GROWTH
Culture: NO GROWTH
Special Requests: ADEQUATE

## 2020-06-03 ENCOUNTER — Ambulatory Visit: Payer: Medicare Other | Admitting: Orthopaedic Surgery

## 2020-06-19 ENCOUNTER — Ambulatory Visit (INDEPENDENT_AMBULATORY_CARE_PROVIDER_SITE_OTHER): Payer: Medicare Other

## 2020-06-19 ENCOUNTER — Ambulatory Visit (INDEPENDENT_AMBULATORY_CARE_PROVIDER_SITE_OTHER): Payer: Medicare Other | Admitting: Orthopaedic Surgery

## 2020-06-19 ENCOUNTER — Other Ambulatory Visit: Payer: Self-pay

## 2020-06-19 ENCOUNTER — Encounter: Payer: Self-pay | Admitting: Orthopaedic Surgery

## 2020-06-19 DIAGNOSIS — M7062 Trochanteric bursitis, left hip: Secondary | ICD-10-CM

## 2020-06-19 MED ORDER — BUPIVACAINE HCL 0.25 % IJ SOLN
2.0000 mL | INTRAMUSCULAR | Status: AC | PRN
  Administered 2020-06-19: 2 mL via INTRA_ARTICULAR

## 2020-06-19 MED ORDER — METHYLPREDNISOLONE ACETATE 40 MG/ML IJ SUSP
40.0000 mg | INTRAMUSCULAR | Status: AC | PRN
  Administered 2020-06-19: 40 mg via INTRA_ARTICULAR

## 2020-06-19 MED ORDER — LIDOCAINE HCL 1 % IJ SOLN
3.0000 mL | INTRAMUSCULAR | Status: AC | PRN
  Administered 2020-06-19: 3 mL

## 2020-06-19 NOTE — Progress Notes (Signed)
Office Visit Note   Patient: Kayla [name].           Date of Birth: 08/09/1965           MRN: [id number] Visit Date: 06/19/2020              Requested by: Caffie Damme, MD [address] [place],  [place] [address] PCP: Caffie Damme, MD   Assessment & Plan: Visit Diagnoses:  1. Trochanteric bursitis of left hip     Plan: Impression is left hip trochanteric bursitis.  Today, we discussed cortisone injection and iliotibial band stretches both of which she agreed to.  She will follow-up with [place] as needed.  Follow-Up Instructions: Return if symptoms worsen or fail to improve.   Orders:  Orders Placed This Encounter  Procedures  . Large Joint Inj: L greater trochanter  . XR HIP UNILAT W OR W/O PELVIS 2-3 VIEWS LEFT   No orders of the defined types were placed in this encounter.     Procedures: Large Joint Inj: L greater trochanter on 06/19/2020 9:40 AM Indications: pain Details: 22 G needle, lateral approach Medications: 3 mL lidocaine 1 %; 2 mL bupivacaine 0.25 %; 40 mg methylPREDNISolone acetate 40 MG/ML      Clinical Data: No additional findings.   Subjective: Chief Complaint  Patient presents with  . Left Hip - Pain    HPI patient is a pleasant 55 year old female who comes in today with left lateral hip pain for the past 6 months or so.  No specific injury that she is aware of but she does note that she had a multilevel spinal fusion by Dr. Franky Macho March 06, 2020.  Most of her back symptoms and leg symptoms improved but she still had continued pain to the lateral hip.  Pain is worse lying on her side in the bed as well as with walking and going from a seated to standing position.  She denies any groin pain.  She is on chronic narcotics which do not seem to help her hip pain at times.  No new paresthesias to the left lower extremity.  Review of Systems as detailed in HPI.  All others reviewed and are negative.   Objective: Vital Signs: There were no  vitals taken for this visit.  Physical Exam well-developed and well-nourished female in no acute distress.  Alert and oriented x3.  Ortho Exam left hip exam shows a negative logroll and negative FADIR.  Marked tenderness to the greater trochanter.  She does have pain and limitation with external rotation of the hip.  She is neurovascular intact distally.  Specialty Comments:  No specialty comments available.  Imaging: XR HIP UNILAT W OR W/O PELVIS 2-3 VIEWS LEFT  Result Date: 06/19/2020 Mild degenerative changes to the left hip    PMFS History: Patient Active Problem List   Diagnosis Date Noted  . Wound drainage 03/27/2020  . Spondylolisthesis of lumbar region 03/06/2020  . Type 2 diabetes mellitus without complication, without long-term current use of insulin (HCC) 12/05/2019  . Chronic pain syndrome 12/05/2019  . Essential hypertension 12/05/2019  . Elevated cholesterol 12/05/2019  . Insomnia secondary to depression with anxiety 12/05/2019  . Class 2 obesity due to excess calories with body mass index (BMI) of 37.0 to 37.9 in adult 12/05/2019  . Healthcare maintenance 12/05/2019   Past Medical History:  Diagnosis Date  . Anxiety   . Arthritis   . Chronic kidney disease    bladder numbness  .  Depression   . Diabetes mellitus without complication (HCC)     Family History  Problem Relation Age of Onset  . Diabetes Mother   . Cancer Mother   . Breast cancer Mother   . Stroke Father     Past Surgical History:  Procedure Laterality Date  . ABDOMINAL HYSTERECTOMY    . APPENDECTOMY    . COLON SURGERY    . FOOT SURGERY     Social History   Occupational History  . Not on file  Tobacco Use  . Smoking status: Former Games developer  . Smokeless tobacco: Never Used  Vaping Use  . Vaping Use: Never used  Substance and Sexual Activity  . Alcohol use: Never  . Drug use: Never  . Sexual activity: Yes

## 2021-01-12 ENCOUNTER — Ambulatory Visit: Payer: Medicare Other | Admitting: Orthopaedic Surgery

## 2021-01-26 ENCOUNTER — Ambulatory Visit (INDEPENDENT_AMBULATORY_CARE_PROVIDER_SITE_OTHER): Payer: Medicare Other | Admitting: Orthopaedic Surgery

## 2021-01-26 ENCOUNTER — Encounter: Payer: Self-pay | Admitting: Orthopaedic Surgery

## 2021-01-26 ENCOUNTER — Other Ambulatory Visit: Payer: Self-pay

## 2021-01-26 ENCOUNTER — Telehealth: Payer: Self-pay

## 2021-01-26 VITALS — Ht 64.0 in | Wt 194.0 lb

## 2021-01-26 DIAGNOSIS — G8929 Other chronic pain: Secondary | ICD-10-CM

## 2021-01-26 DIAGNOSIS — M545 Low back pain, unspecified: Secondary | ICD-10-CM

## 2021-01-26 NOTE — Progress Notes (Signed)
Office Visit Note   Patient: Kayla [name].           Date of Birth: 1966-02-01           MRN: [id number] Visit Date: 01/26/2021              Requested by: Caffie Damme, MD [address] [place],  [place] [address] PCP: Caffie Damme, MD   Assessment & Plan: Visit Diagnoses:  1. Chronic left-sided low back pain without sciatica     Plan: Given the lack of relief from the injection and the fact that all signs point to her back as the source of her continued pain I have recommended referral to Dr. Otelia Sergeant for further evaluation and treatment.  Patient did have a spine fusion back in October 2021 by Dr. Franky Macho but the patient requested to see another spine surgeon instead.  Follow-Up Instructions: Return for needs appt with Dr. Otelia Sergeant asap.   Orders:  No orders of the defined types were placed in this encounter.  No orders of the defined types were placed in this encounter.     Procedures: No procedures performed   Clinical Data: No additional findings.   Subjective: Chief Complaint  Patient presents with   Lower Back - Pain    HPI Kayla [name] returns today for chronic left hip and low back pain.  Prior trochanteric bursa injection did not give any relief.  She feels that that the pain is now become more localized in the left lumbar spine region.  Denies any groin pain.  Review of Systems   Objective: Vital Signs: Ht 5\' 4"  (1.626 m)   Wt 194 lb (88 kg)   BMI 33.30 kg/m   Physical Exam  Ortho Exam Examination of the left hip shows good range of motion without any groin or hip pain.  Lateral hip is not overly tender.  Lumbar spine is quite tender to palpation.  Specialty Comments:  No specialty comments available.  Imaging: No results found.   PMFS History: Patient Active Problem List   Diagnosis Date Noted   Wound drainage 03/27/2020   Spondylolisthesis of lumbar region 03/06/2020   Type 2 diabetes mellitus without complication, without long-term  current use of insulin (HCC) 12/05/2019   Chronic pain syndrome 12/05/2019   Essential hypertension 12/05/2019   Elevated cholesterol 12/05/2019   Insomnia secondary to depression with anxiety 12/05/2019   Class 2 obesity due to excess calories with body mass index (BMI) of 37.0 to 37.9 in adult 12/05/2019   Healthcare maintenance 12/05/2019   Past Medical History:  Diagnosis Date   Anxiety    Arthritis    Chronic kidney disease    bladder numbness   Depression    Diabetes mellitus without complication (HCC)     Family History  Problem Relation Age of Onset   Diabetes Mother    Cancer Mother    Breast cancer Mother    Stroke Father     Past Surgical History:  Procedure Laterality Date   ABDOMINAL HYSTERECTOMY     APPENDECTOMY     COLON SURGERY     FOOT SURGERY     Social History   Occupational History   Not on file  Tobacco Use   Smoking status: Former   Smokeless tobacco: Never  Vaping Use   Vaping Use: Never used  Substance and Sexual Activity   Alcohol use: Never   Drug use: Never   Sexual activity: Yes

## 2021-01-26 NOTE — Telephone Encounter (Signed)
Pt came into the office and was seen by Roda Shutters today. Per Roda Shutters he wanted her to see Dr. Otelia Sergeant asap.   Please advise. All her records are in the chart

## 2021-01-27 NOTE — Telephone Encounter (Signed)
Sched for 02/25/21  @ 10:30, added to cancellation list

## 2021-02-25 ENCOUNTER — Ambulatory Visit (INDEPENDENT_AMBULATORY_CARE_PROVIDER_SITE_OTHER): Payer: Medicare Other | Admitting: Specialist

## 2021-02-25 ENCOUNTER — Ambulatory Visit: Payer: Self-pay

## 2021-02-25 ENCOUNTER — Encounter: Payer: Self-pay | Admitting: Specialist

## 2021-02-25 ENCOUNTER — Other Ambulatory Visit: Payer: Self-pay

## 2021-02-25 VITALS — BP 131/83 | HR 79 | Ht 64.0 in | Wt 194.0 lb

## 2021-02-25 DIAGNOSIS — G8929 Other chronic pain: Secondary | ICD-10-CM | POA: Diagnosis not present

## 2021-02-25 DIAGNOSIS — M47818 Spondylosis without myelopathy or radiculopathy, sacral and sacrococcygeal region: Secondary | ICD-10-CM

## 2021-02-25 DIAGNOSIS — M48062 Spinal stenosis, lumbar region with neurogenic claudication: Secondary | ICD-10-CM

## 2021-02-25 DIAGNOSIS — M545 Low back pain, unspecified: Secondary | ICD-10-CM

## 2021-02-25 DIAGNOSIS — M4156 Other secondary scoliosis, lumbar region: Secondary | ICD-10-CM | POA: Diagnosis not present

## 2021-02-25 DIAGNOSIS — M47819 Spondylosis without myelopathy or radiculopathy, site unspecified: Secondary | ICD-10-CM

## 2021-02-25 DIAGNOSIS — M5136 Other intervertebral disc degeneration, lumbar region: Secondary | ICD-10-CM | POA: Diagnosis not present

## 2021-02-25 DIAGNOSIS — M4316 Spondylolisthesis, lumbar region: Secondary | ICD-10-CM

## 2021-02-25 MED ORDER — DICLOFENAC POTASSIUM 50 MG PO TABS: 50.0000 mg | ORAL_TABLET | Freq: Two times a day (BID) | ORAL | 3 refills | Status: DC

## 2021-02-25 NOTE — Patient Instructions (Signed)
Plan: Avoid bending, stooping and avoid lifting weights greater than 10 lbs. Avoid prolong standing and walking. Avoid frequent bending and stooping  No lifting greater than 10 lbs. May use ice or moist heat for pain. Weight loss is of benefit. Handicap license is approved. Dr. [place]videre Blas secretary/Assistant will call to arrange for left Sacroiliac joint injection   Pool walking exercise I believe you have a solid fusion and left SI joint pain and residual lumbar degenerative discs at L2-3 and L3-4 above the L4-S1 fusion.  Your strength is normal in the left foot but left abductors appear weak and this may be neurogenic or due to Sacroiliac dysfunction.  Start diclofenac or arthrosis pain.  Follow-Up Instructions: No follow-ups on file.

## 2021-02-25 NOTE — Progress Notes (Signed)
Office Visit Note   Patient: Kayla [name].           Date of Birth: [birth year]           MRN: [id number] Visit Date: 02/25/2021              Requested by: Caffie Damme, MD [address] [place],  [place] [address] PCP: Caffie Damme, MD   Assessment & Plan: Visit Diagnoses:  1. Chronic left-sided low back pain without sciatica   2. Lumbar degenerative disc disease   3. Spondylosis without myelopathy or radiculopathy   4. Other secondary scoliosis, lumbar region   5. Spinal stenosis of lumbar region with neurogenic claudication   6. Spondylolisthesis, lumbar region   7. Arthritis of left sacroiliac joint     Plan: Avoid bending, stooping and avoid lifting weights greater than 10 lbs. Avoid prolong standing and walking. Avoid frequent bending and stooping  No lifting greater than 10 lbs. May use ice or moist heat for pain. Weight loss is of benefit. Handicap license is approved. Dr. Magnolia Blas secretary/Assistant will call to arrange for left Sacroiliac joint injection   Pool walking exercise I believe you have a solid fusion and left SI joint pain and residual lumbar degenerative discs at L2-3 and L3-4 above the L4-S1 fusion.  Your strength is normal in the left foot but left abductors appear weak and this may be neurogenic or due to Sacroiliac dysfunction.  Start diclofenac or arthrosis pain.  Follow-Up Instructions: No follow-ups on file.   Orders:  Orders Placed This Encounter  Procedures   XR Lumbar Spine 2-3 Views   No orders of the defined types were placed in this encounter.     Procedures: No procedures performed   Clinical Data: No additional findings.   Subjective: No chief complaint on file.   55 year old female with history of L4-5 and L5-S1 bilateral TLIFs with pedicle screw and rod fixation for spondylolisthesis L5-S1 and DDD with stenosis at L4-5. She had persistent pain post op with predominantly left lumbar and lumbosacral pain with  pain into the left lateral thigh and knee and calf. She has difficulty bearing weight on the left hip and leg and she has pain with stairs. Able to reach her feet and shoes and socks with sitting. Pain is a burning and aching and sometimes sharp. She has pain with standing and walking and leans on the cart if she is able to get it from her mother while grocery shopping. Pain with sitting, bending, stooping, lifting, carrying, vacuuming, sweeping and raking. She takes meds for pain oxycontin 60 mg every 12 hours and 15 mg every 3-4 hours for pain.    Review of Systems   Objective: Vital Signs: BP 131/83   Pulse 79   Ht 5\' 4"  (1.626 m)   Wt 194 lb (88 kg)   BMI 33.30 kg/m   Physical Exam  Ortho Exam  Specialty Comments:  No specialty comments available.  Imaging: No results found.   PMFS History: Patient Active Problem List   Diagnosis Date Noted   Wound drainage 03/27/2020   Spondylolisthesis of lumbar region 03/06/2020   Type 2 diabetes mellitus without complication, without long-term current use of insulin (HCC) 12/05/2019   Chronic pain syndrome 12/05/2019   Essential hypertension 12/05/2019   Elevated cholesterol 12/05/2019   Insomnia secondary to depression with anxiety 12/05/2019   Class 2 obesity due to excess calories with body mass index (BMI) of 37.0  to 37.9 in adult 12/05/2019   Healthcare maintenance 12/05/2019   Past Medical History:  Diagnosis Date   Anxiety    Arthritis    Chronic kidney disease    bladder numbness   Depression    Diabetes mellitus without complication (HCC)     Family History  Problem Relation Age of Onset   Diabetes Mother    Cancer Mother    Breast cancer Mother    Stroke Father     Past Surgical History:  Procedure Laterality Date   ABDOMINAL HYSTERECTOMY     APPENDECTOMY     COLON SURGERY     FOOT SURGERY     Social History   Occupational History   Not on file  Tobacco Use   Smoking status: Former   Smokeless  tobacco: Never  Vaping Use   Vaping Use: Never used  Substance and Sexual Activity   Alcohol use: Never   Drug use: Never   Sexual activity: Yes

## 2021-03-11 ENCOUNTER — Encounter: Payer: Self-pay | Admitting: Physical Medicine and Rehabilitation

## 2021-03-11 ENCOUNTER — Ambulatory Visit (INDEPENDENT_AMBULATORY_CARE_PROVIDER_SITE_OTHER): Payer: Medicare Other | Admitting: Physical Medicine and Rehabilitation

## 2021-03-11 ENCOUNTER — Other Ambulatory Visit: Payer: Self-pay

## 2021-03-11 ENCOUNTER — Ambulatory Visit: Payer: Self-pay

## 2021-03-11 DIAGNOSIS — M461 Sacroiliitis, not elsewhere classified: Secondary | ICD-10-CM | POA: Diagnosis not present

## 2021-03-11 NOTE — Progress Notes (Signed)
Kayla [name] - 55 y.o. female MRN [id number]  Date of birth: 02/29/1966  Office Visit Note: Visit Date: 03/11/2021 PCP: Caffie Damme, MD Referred by: Caffie Damme, MD  Subjective: Chief Complaint  Patient presents with   Left Hip - Pain   HPI:  Kayla [name] is a 55 y.o. female who comes in today  at the request of Dr. Vira Browns for planned Left anesthetic sacroiliac joint arthrogram with fluoroscopic guidance.  The patient has failed conservative care including home exercise, medications, time and activity modification.  This injection will be diagnostic and hopefully therapeutic.  Please see requesting physician notes for further details and justification.   ROS Otherwise per HPI.  Assessment & Plan: Visit Diagnoses:    ICD-10-CM   1. Sacroiliitis (HCC)  M46.1 XR C-ARM NO REPORT    Sacroiliac Joint Inj      Plan: No additional findings.   Meds & Orders: No orders of the defined types were placed in this encounter.   Orders Placed This Encounter  Procedures   Sacroiliac Joint Inj   XR C-ARM NO REPORT    Follow-up: Return for visit to requesting physician as needed.   Procedures: Sacroiliac Joint Inj (Left) on 03/11/2021 8:26 AM Indications: pain and diagnostic evaluation Details: 22 G 3.5 in needle, fluoroscopy-guided posterior approach Medications: 2 mL bupivacaine 0.5 %; 80 mg methylPREDNISolone acetate 80 MG/ML Outcome: tolerated well, no immediate complications  Sacroiliac Joint Intra-Articular Injection - Posterior Approach with Fluoroscopic Guidance   Position: PRONE  Additional Comments: Vital signs were monitored before and after the procedure. Patient was prepped and draped in the usual sterile fashion. The correct patient, procedure, and site was verified.   Injection Procedure Details:   Location/Site:  Sacroiliac joint  Needle size: 3.5 in Spinal Needle  Needle type: Spinal  Needle Placement:  Intra-articular  Findings:  -Comments: There was excellent flow of contrast producing a partial arthrogram of the sacroiliac joint.   Procedure Details: Starting with a 90 degree vertical and midline orientation the fluoroscope was tilted cranially 20 to 25 degrees and the target area of the inferior most part of the SI joint on the side mentioned above was visualized.  The soft tissues overlying this target were infiltrated with 4 ml. of 1% Lidocaine without Epinephrine. A #22 gauge spinal needle was inserted perpendicular to the fluoroscope table and advanced into the posterior inferior joint space using fluoroscopic guidance.  Position in the joint space was confirmed by obtaining a partial arthrogram using a 2 ml. volume of Isovue-250 contrast agent. After negative aspirate for gross pus or blood, the injectate was delivered to the joint. Radiographs were obtained for documentation purposes.   Additional Comments:   Dressing: Bandaid    Post-procedure details: Patient was observed during the procedure. Post-procedure instructions were reviewed.  Patient left the clinic in stable condition.    There was excellent flow of contrast producing a partial arthrogram of the sacroiliac joint.  Procedure, treatment alternatives, risks and benefits explained, specific risks discussed. Consent was given by the patient. Immediately prior to procedure a time out was called to verify the correct patient, procedure, equipment, support staff and site/side marked as required. Patient was prepped and draped in the usual sterile fashion.         Clinical History: No specialty comments available.     Objective:  VS:  HT:    WT:   BMI:     BP:   HR: bpm  TEMP: ( )  RESP:  Physical Exam Vitals and nursing note reviewed.  Constitutional:      General: She is not in acute distress.    Appearance: Normal appearance. She is not ill-appearing.  HENT:     Head: Normocephalic and atraumatic.      Right Ear: External ear normal.     Left Ear: External ear normal.  Eyes:     Extraocular Movements: Extraocular movements intact.  Cardiovascular:     Rate and Rhythm: Normal rate.     Pulses: Normal pulses.  Pulmonary:     Effort: Pulmonary effort is normal. No respiratory distress.  Abdominal:     General: There is no distension.     Palpations: Abdomen is soft.  Musculoskeletal:        General: Tenderness present.     Cervical back: Neck supple.     Right lower leg: No edema.     Left lower leg: No edema.     Comments: Patient has good distal strength with no pain over the greater trochanters.  No clonus or focal weakness.  Positive right Patrick's and positive right Fortin finger test.  Skin:    Findings: No erythema, lesion or rash.  Neurological:     General: No focal deficit present.     Mental Status: She is alert and oriented to person, place, and time.     Sensory: No sensory deficit.     Motor: No weakness or abnormal muscle tone.     Coordination: Coordination normal.  Psychiatric:        Mood and Affect: Mood normal.        Behavior: Behavior normal.     Imaging: No results found.

## 2021-03-11 NOTE — Progress Notes (Signed)
Pt state left hip pain. Pt state walking, standing and laying down makes the pain worse. Pt state she takes pain meds to help ease her pain.  Numeric Pain Rating Scale and Functional Assessment Average Pain 8   In the last MONTH (on 0-10 scale) has pain interfered with the following?  1. General activity like being  able to carry out your everyday physical activities such as walking, climbing stairs, carrying groceries, or moving a chair?  Rating(10)    -BT, -Dye Allergies.  

## 2021-03-14 MED ORDER — BUPIVACAINE HCL 0.5 % IJ SOLN
2.0000 mL | INTRAMUSCULAR | Status: AC | PRN
  Administered 2021-03-11: 2 mL via INTRA_ARTICULAR

## 2021-03-14 MED ORDER — METHYLPREDNISOLONE ACETATE 80 MG/ML IJ SUSP
80.0000 mg | INTRAMUSCULAR | Status: AC | PRN
  Administered 2021-03-11: 80 mg via INTRA_ARTICULAR

## 2021-04-01 ENCOUNTER — Ambulatory Visit: Payer: Medicare Other | Admitting: Specialist

## 2021-05-03 ENCOUNTER — Ambulatory Visit: Payer: Medicare Other | Admitting: Specialist

## 2021-05-12 ENCOUNTER — Ambulatory Visit: Payer: Medicare Other | Admitting: Specialist

## 2021-06-24 ENCOUNTER — Encounter: Payer: Self-pay | Admitting: Specialist

## 2021-06-29 ENCOUNTER — Other Ambulatory Visit: Payer: Self-pay | Admitting: Specialist

## 2021-07-08 ENCOUNTER — Ambulatory Visit (INDEPENDENT_AMBULATORY_CARE_PROVIDER_SITE_OTHER): Payer: Medicare Other | Admitting: Specialist

## 2021-07-08 ENCOUNTER — Other Ambulatory Visit: Payer: Self-pay

## 2021-07-08 ENCOUNTER — Encounter: Payer: Self-pay | Admitting: Specialist

## 2021-07-08 ENCOUNTER — Ambulatory Visit (INDEPENDENT_AMBULATORY_CARE_PROVIDER_SITE_OTHER): Payer: Medicare Other

## 2021-07-08 VITALS — BP 136/90 | HR 64 | Ht 64.0 in | Wt 194.0 lb

## 2021-07-08 DIAGNOSIS — M4156 Other secondary scoliosis, lumbar region: Secondary | ICD-10-CM

## 2021-07-08 DIAGNOSIS — M461 Sacroiliitis, not elsewhere classified: Secondary | ICD-10-CM

## 2021-07-08 DIAGNOSIS — M47818 Spondylosis without myelopathy or radiculopathy, sacral and sacrococcygeal region: Secondary | ICD-10-CM

## 2021-07-08 DIAGNOSIS — M4316 Spondylolisthesis, lumbar region: Secondary | ICD-10-CM | POA: Diagnosis not present

## 2021-07-08 DIAGNOSIS — M5136 Other intervertebral disc degeneration, lumbar region: Secondary | ICD-10-CM

## 2021-07-08 DIAGNOSIS — G8929 Other chronic pain: Secondary | ICD-10-CM

## 2021-07-08 DIAGNOSIS — M7062 Trochanteric bursitis, left hip: Secondary | ICD-10-CM

## 2021-07-08 DIAGNOSIS — M47819 Spondylosis without myelopathy or radiculopathy, site unspecified: Secondary | ICD-10-CM

## 2021-07-08 DIAGNOSIS — M545 Low back pain, unspecified: Secondary | ICD-10-CM

## 2021-07-08 DIAGNOSIS — Z981 Arthrodesis status: Secondary | ICD-10-CM

## 2021-07-08 DIAGNOSIS — M48062 Spinal stenosis, lumbar region with neurogenic claudication: Secondary | ICD-10-CM

## 2021-07-08 DIAGNOSIS — M51369 Other intervertebral disc degeneration, lumbar region without mention of lumbar back pain or lower extremity pain: Secondary | ICD-10-CM

## 2021-07-08 NOTE — Patient Instructions (Signed)
Avoid bending, stooping and avoid lifting weights greater than 10 lbs. Avoid prolong standing and walking. Avoid frequent bending and stooping  No lifting greater than 10 lbs. May use ice or moist heat for pain. Weight loss is of benefit. Handicap license is approved. MRI with and without contrast lumbar spine to assess for further collapse of scoliosis above fusion with left sided neurforamenal stenosis.

## 2021-07-08 NOTE — Progress Notes (Signed)
Office Visit Note   Patient: Kayla [name].           Date of Birth: 16-Aug-1965           MRN: [id number] Visit Date: 07/08/2021              Requested by: Caffie Damme, MD [address] [place],  [place] [address] PCP: Caffie Damme, MD   Assessment & Plan: Visit Diagnoses:  1. Other secondary scoliosis, lumbar region   2. Lumbar degenerative disc disease   3. Spondylosis without myelopathy or radiculopathy   4. Spondylolisthesis, lumbar region   5. Arthritis of left sacroiliac joint   6. Spinal stenosis of lumbar region with neurogenic claudication   7. Trochanteric bursitis of left hip   8. Chronic left-sided low back pain without sciatica   9. History of lumbar spinal fusion     Plan: Avoid bending, stooping and avoid lifting weights greater than 10 lbs. Avoid prolong standing and walking. Avoid frequent bending and stooping  No lifting greater than 10 lbs. May use ice or moist heat for pain. Weight loss is of benefit. Handicap license is approved. MRI with and without contrast lumbar spine to assess for further collapse of scoliosis above fusion with left sided neurforamenal stenosis.  Follow-Up Instructions: Return in about 3 weeks (around 07/29/2021).   Orders:  Orders Placed This Encounter  Procedures   XR Lumbar Spine 2-3 Views   No orders of the defined types were placed in this encounter.     Procedures: No procedures performed   Clinical Data: No additional findings.   Subjective: Chief Complaint  Patient presents with   Lower Back - Follow-up    56 year old female with history of lumbar fusion surgery by Dr. Mikal Plane in 03/2020. She had TLIFs at L4-5 and L5-S1 for DDD and spondylosis L4-5 and Spondylolisthesis L5-S1. Post op complicated with CSF leak and a return to the OR for repair.  Review of Systems   Objective: Vital Signs: BP 136/90 (BP Location: Left Arm, Patient Position: Sitting)    Pulse 64    Ht 5\' 4"  (1.626 m)    Wt 194  lb (88 kg)    BMI 33.30 kg/m   Physical Exam  Ortho Exam  Specialty Comments:  No specialty comments available.  Imaging: XR Lumbar Spine 2-3 Views  Result Date: 07/08/2021 Right lumbar scoliosis surgery. Worsening assymetric collapse of the superior adjacent disc L3-4 and left lateral listhesis of L2-3. MRI with DDD at these levels but no foramenal stenosis at the time of the MRI.     PMFS History: Patient Active Problem List   Diagnosis Date Noted   Wound drainage 03/27/2020   Spondylolisthesis of lumbar region 03/06/2020   Type 2 diabetes mellitus without complication, without long-term current use of insulin (HCC) 12/05/2019   Chronic pain syndrome 12/05/2019   Essential hypertension 12/05/2019   Elevated cholesterol 12/05/2019   Insomnia secondary to depression with anxiety 12/05/2019   Class 2 obesity due to excess calories with body mass index (BMI) of 37.0 to 37.9 in adult 12/05/2019   Healthcare maintenance 12/05/2019   Past Medical History:  Diagnosis Date   Anxiety    Arthritis    Chronic kidney disease    bladder numbness   Depression    Diabetes mellitus without complication (HCC)     Family History  Problem Relation Age of Onset   Diabetes Mother    Cancer Mother  Breast cancer Mother    Stroke Father     Past Surgical History:  Procedure Laterality Date   ABDOMINAL HYSTERECTOMY     APPENDECTOMY     COLON SURGERY     FOOT SURGERY     Social History   Occupational History   Not on file  Tobacco Use   Smoking status: Former   Smokeless tobacco: Never  Vaping Use   Vaping Use: Never used  Substance and Sexual Activity   Alcohol use: Never   Drug use: Never   Sexual activity: Yes

## 2021-07-20 ENCOUNTER — Encounter: Payer: Self-pay | Admitting: Specialist

## 2021-07-22 ENCOUNTER — Ambulatory Visit: Payer: Medicare Other | Admitting: Specialist

## 2021-07-24 ENCOUNTER — Other Ambulatory Visit: Payer: Self-pay

## 2021-07-24 ENCOUNTER — Ambulatory Visit (HOSPITAL_BASED_OUTPATIENT_CLINIC_OR_DEPARTMENT_OTHER)
Admission: RE | Admit: 2021-07-24 | Discharge: 2021-07-24 | Disposition: A | Discharge status: Home / Self Care | Payer: Medicare Other | Source: Ambulatory Visit | Attending: Specialist | Admitting: Specialist

## 2021-07-24 DIAGNOSIS — M5136 Other intervertebral disc degeneration, lumbar region: Secondary | ICD-10-CM

## 2021-07-24 DIAGNOSIS — M4156 Other secondary scoliosis, lumbar region: Secondary | ICD-10-CM

## 2021-07-24 DIAGNOSIS — M48062 Spinal stenosis, lumbar region with neurogenic claudication: Secondary | ICD-10-CM | POA: Diagnosis present

## 2021-07-24 DIAGNOSIS — M4316 Spondylolisthesis, lumbar region: Secondary | ICD-10-CM

## 2021-07-24 IMAGING — MR MR LUMBAR SPINE WO/W CM
4 of 8 series · 22 of 48 positions shown · IV contrast (GADAVIST)
Comparison: Radiography [DATE].  MRI [DATE]

CLINICAL DATA: Low back pain, symptoms persist with greater than 6
weeks treatment. Spondylosis. Low back pain radiating to the left
leg with weakness.

EXAM:
MRI LUMBAR SPINE WITHOUT AND WITH CONTRAST
TECHNIQUE: Multiplanar and multiecho pulse sequences of the lumbar spine were
obtained without and with intravenous contrast.
CONTRAST:  7.5mL GADAVIST GADOBUTROL 1 MMOL/ML IV SOLN

[Series 2: T1 · sagittal · 4.0mm · 0.81mm/px · 3 of 16 slices shown (1 of 2)]
[im 1/16]
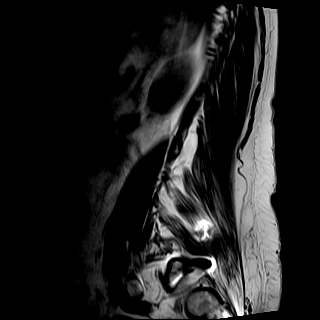
[im 8/16]
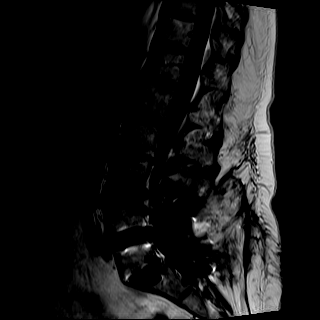
[im 16/16]
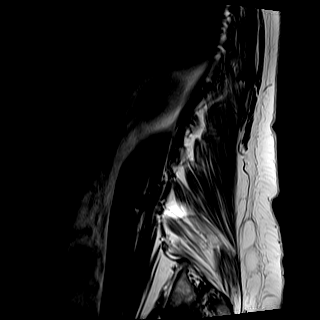

[Series 4: T2 · axial · 4.0mm · 0.39mm/px · z∈[-59,+136]mm · 8 of 40 slices shown (1 of 2)]
[im 1/40]
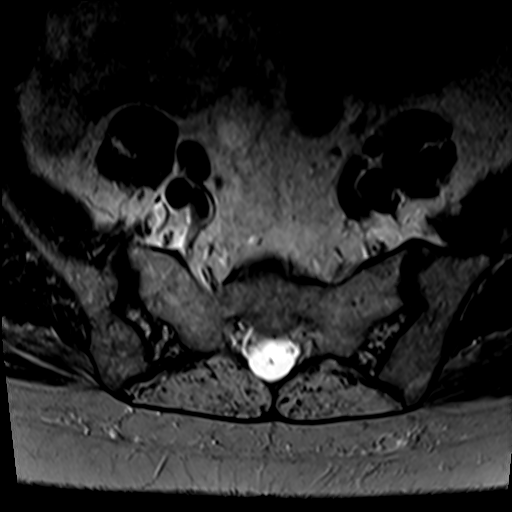
[im 5/40]
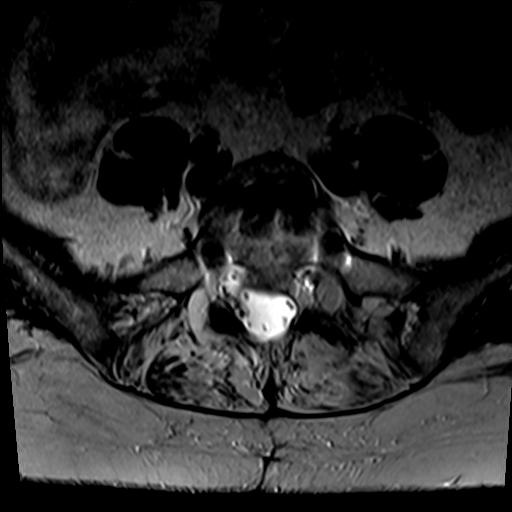
[im 14/40]
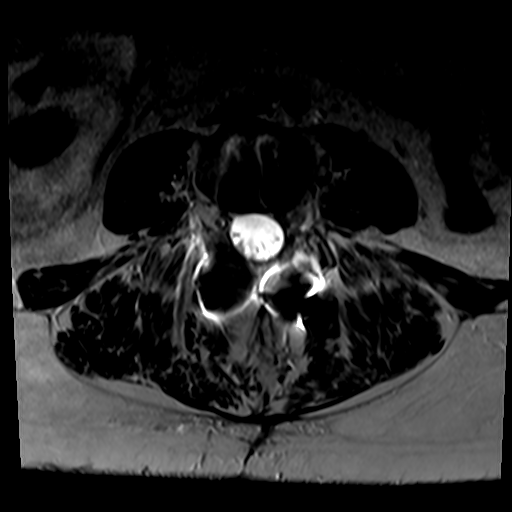
[im 18/40]
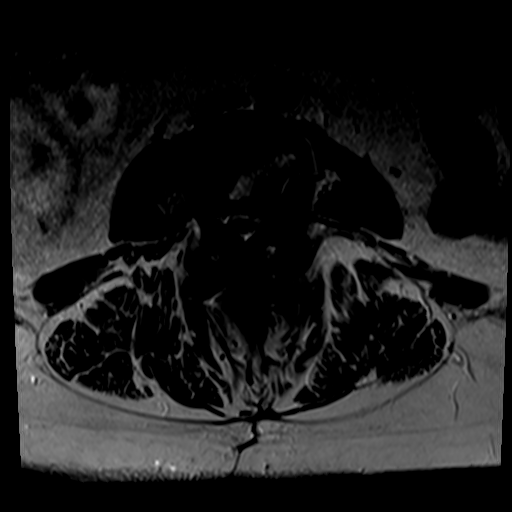
[im 22/40]
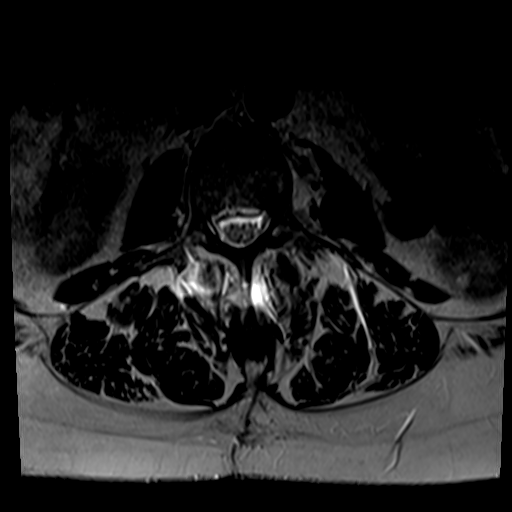
[im 27/40]
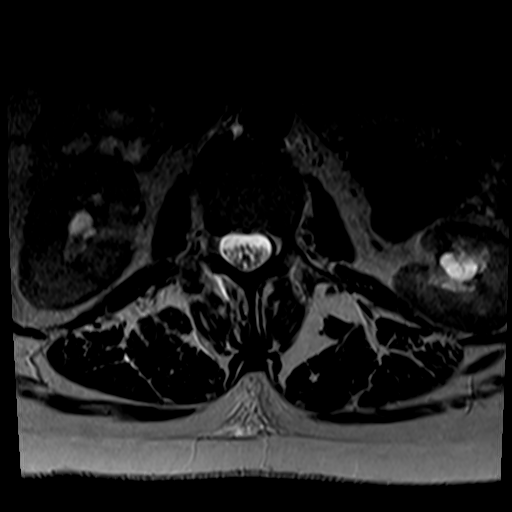
[im 35/40]
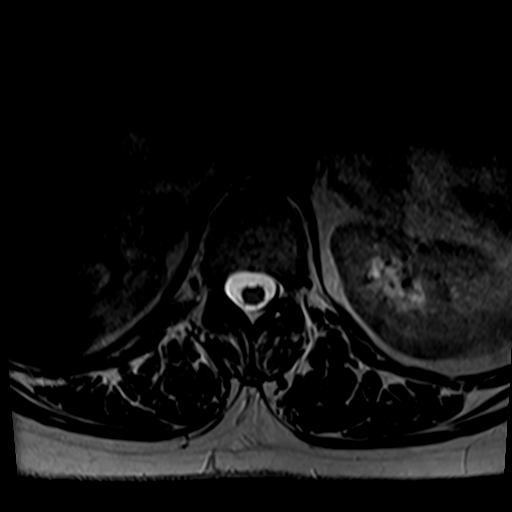
[im 40/40]
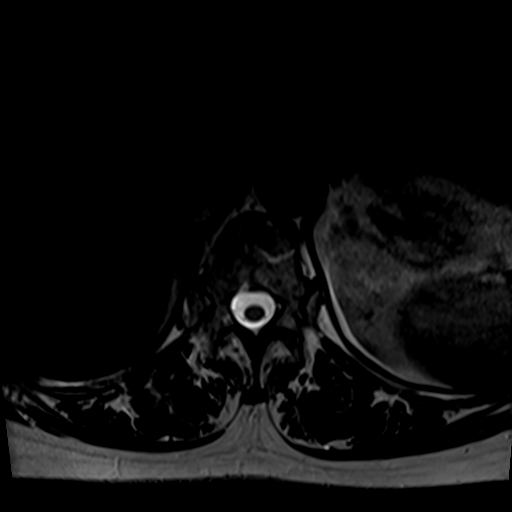

[Series 5: T1 · axial · 4.0mm · 0.39mm/px · z∈[-59,+111]mm · 7 of 40 slices shown (2 of 2)]
[im 1/40]
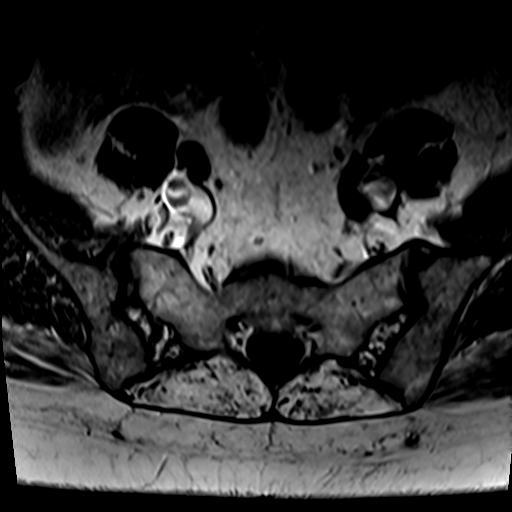
[im 5/40]
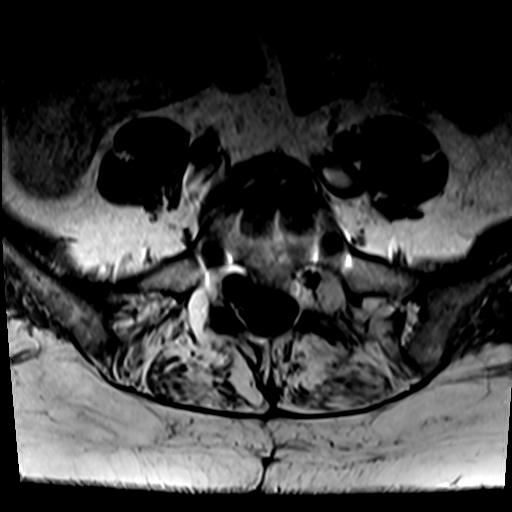
[im 14/40]
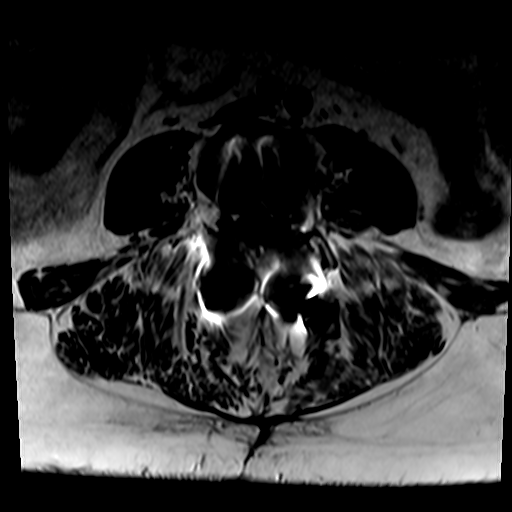
[im 18/40]
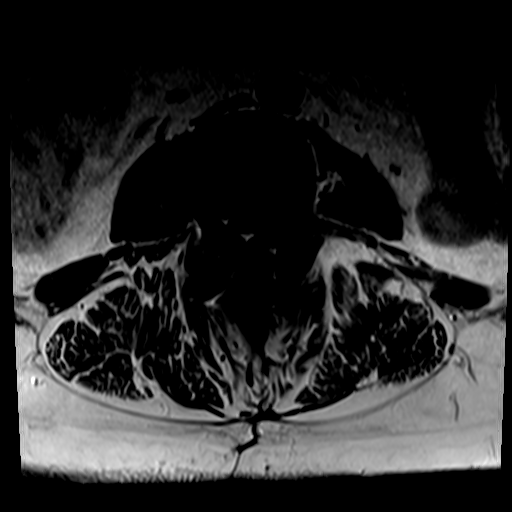
[im 22/40]
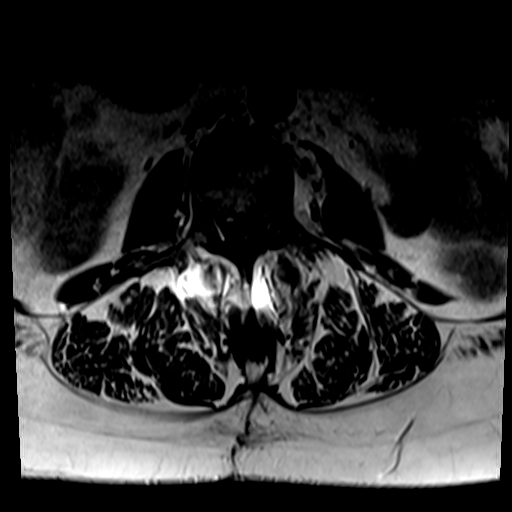
[im 27/40]
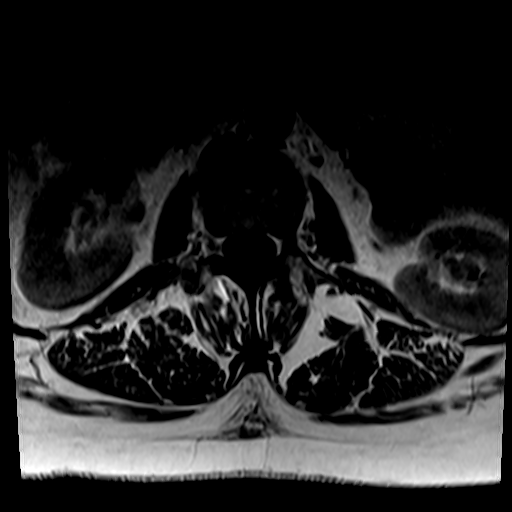
[im 35/40]
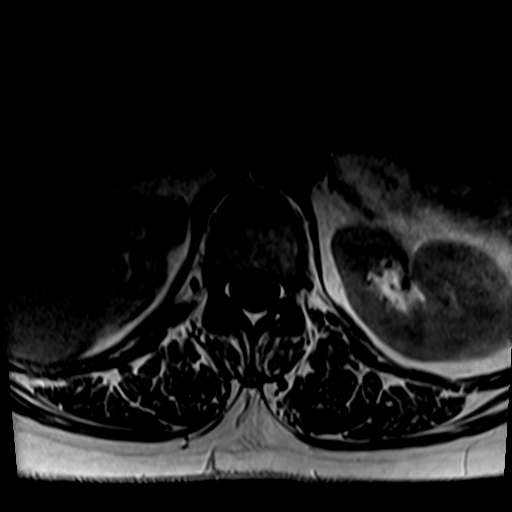

[Series 6: T2 · sagittal · 4.0mm · 0.81mm/px · 4 of 16 slices shown (2 of 2)]
[im 1/16]
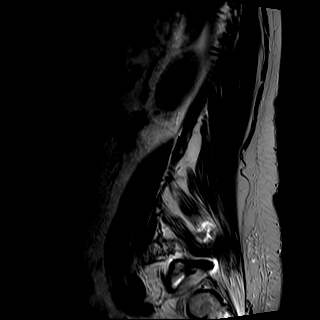
[im 6/16]
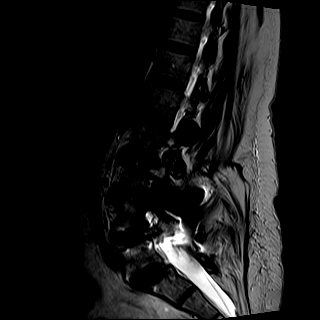
[im 11/16]
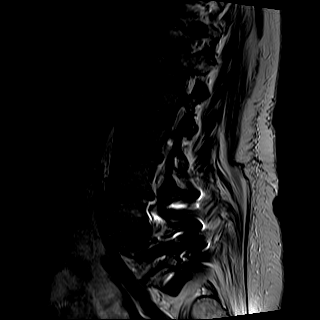
[im 16/16]
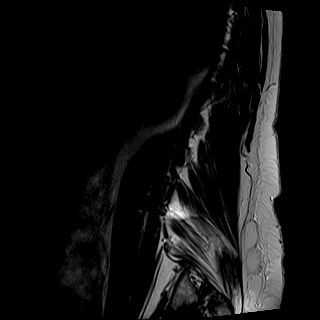

[22 of 48 positions shown; findings below may reference images not displayed]

FINDINGS: Segmentation: 5 lumbar type vertebral bodies as numbered previously.

Alignment: Fixed anterolisthesis at L5-S1 of 3 mm. 2 mm
retrolisthesis at L2-3 and L3-4.

Vertebrae: Previous discectomy and fusion procedure from L4 to the
sacrum.

Conus medullaris and cauda equina: Conus extends to the L2 level.
Conus and cauda equina appear normal.

Paraspinal and other soft tissues: Negative. Previously seen
postoperative subcutaneous fluid collection has resolved.

Disc levels:

T10-11: Facet and ligamentous hypertrophy right worse than left with
canal encroachment and possible deformity of the distal corner.
Could this relate to any the patient's symptoms?

T11-12: Minimal facet hypertrophy.  No compressive stenosis.

T12-L1 and L1-2: Normal disc spaces. Mild facet hypertrophy at L1-2
but no stenosis.

L2-3: Endplate osteophytes and bulging of the disc. Mild facet and
ligamentous hypertrophy. Mild stenosis of the lateral recesses
without neural compression.

L3-4: 2 mm retrolisthesis. Disc degeneration with endplate
osteophytes and broad-based protrusion of the disc. Extruded
fragment migrated caudally in the left lateral recess down to mid
L4. Facet hypertrophy. Multifactorial stenosis at the disc level
that could be significant. Left-sided neural compression quite
possible due to the extruded migrated fragment.

L4-5 and L5-S1: Previous posterior decompression, diskectomy and
fusion. No complicating feature seen. Apparent sufficient patency of
the canal and foramina.
IMPRESSION: Previous discectomy, posterior decompression and fusion from L4 to
the sacrum. Resolution of the previously seen posterior fluid
collection.

Marked worsening of disease at the L3-4 level. 2 mm retrolisthesis.
Broad-based disc herniation more prominent towards the left.
Extruded fragment migrated caudally in the left lateral recess.
Facet and ligamentous hypertrophy. Stenosis at the disc level could
be significant. Left-sided stenosis because of the caudally migrated
fragment could be significant.

L2-3: 2 mm retrolisthesis. Bulging of the disc. Facet hypertrophy.
Mild stenosis without definite neural compression.

T10-11: Pronounced facet and ligamentous hypertrophy with canal
encroachment. This may cause some deformity of the distal thoracic
cord.

## 2021-07-24 MED ORDER — GADOBUTROL 1 MMOL/ML IV SOLN
7.5000 mL | Freq: Once | INTRAVENOUS | Status: AC | PRN
  Administered 2021-07-24: 7.5 mL via INTRAVENOUS

## 2021-07-26 ENCOUNTER — Telehealth: Payer: Self-pay | Admitting: Specialist

## 2021-07-26 NOTE — Telephone Encounter (Signed)
Pt called. Doesn't want to wait until 03/23 to be seen. Can we work pt in? She never made appt after last visit

## 2021-07-27 NOTE — Telephone Encounter (Signed)
Worked in on 07/28/21 @ 1030

## 2021-07-28 ENCOUNTER — Encounter: Payer: Self-pay | Admitting: Specialist

## 2021-07-28 ENCOUNTER — Other Ambulatory Visit: Payer: Self-pay

## 2021-07-28 ENCOUNTER — Ambulatory Visit (INDEPENDENT_AMBULATORY_CARE_PROVIDER_SITE_OTHER): Payer: Medicare Other | Admitting: Specialist

## 2021-07-28 VITALS — BP 118/79 | HR 92 | Ht 64.0 in | Wt 194.0 lb

## 2021-07-28 DIAGNOSIS — Z981 Arthrodesis status: Secondary | ICD-10-CM

## 2021-07-28 DIAGNOSIS — M7062 Trochanteric bursitis, left hip: Secondary | ICD-10-CM | POA: Diagnosis not present

## 2021-07-28 DIAGNOSIS — M48062 Spinal stenosis, lumbar region with neurogenic claudication: Secondary | ICD-10-CM | POA: Diagnosis not present

## 2021-07-28 DIAGNOSIS — M4316 Spondylolisthesis, lumbar region: Secondary | ICD-10-CM | POA: Diagnosis not present

## 2021-07-28 DIAGNOSIS — M5136 Other intervertebral disc degeneration, lumbar region: Secondary | ICD-10-CM

## 2021-07-28 DIAGNOSIS — M5126 Other intervertebral disc displacement, lumbar region: Secondary | ICD-10-CM

## 2021-07-28 DIAGNOSIS — M4156 Other secondary scoliosis, lumbar region: Secondary | ICD-10-CM

## 2021-07-28 NOTE — Patient Instructions (Addendum)
Avoid bending, stooping and avoid lifting weights greater than 10 lbs. Avoid prolong standing and walking. Order for a new walker with wheels. Surgery scheduling secretary Tivis Ringer, will call you in the next week to schedule for surgery.  Surgery recommended is a two level lumbar fusion L2-3 andL3-4 this would be done with rods, screws and cages with local bone graft and allograft (donor bone graft). Plan to remove the rods from previous surgery and replace with longer rods extending to the new 2 levels  above being fused.  Take hydrocodone for for pain. Risk of surgery includes risk of infection 1 in 200 patients, bleeding 1/2% chance you would need a transfusion.   Risk to the nerves is one in 10,000. You will need to use a brace for 3 months and wean from the brace on the 4th month. Expect improved walking and standing tolerance. Expect relief of leg pain but numbness may persist depending on the length and degree of pressure that has been present.

## 2021-07-28 NOTE — Progress Notes (Addendum)
Office Visit Note   Patient: Kayla [name].           Date of Birth: [birth year]           MRN: [id number] Visit Date: 07/28/2021              Requested by: Caffie Damme, MD [address] [place],  [place] [address] PCP: Caffie Damme, MD   Assessment & Plan: Visit Diagnoses:  1. Spondylolisthesis, lumbar region   2. Spinal stenosis of lumbar region with neurogenic claudication   3. Trochanteric bursitis of left hip   4. Herniated nucleus pulposus, lumbar     Plan: Avoid bending, stooping and avoid lifting weights greater than 10 lbs. Avoid prolong standing and walking. Order for a new walker with wheels. Surgery scheduling secretary Tivis Ringer, will call you in the next week to schedule for surgery.  Surgery recommended is an extension of the previous fusion by two levels with  lumbar fusion left L2-3 and L3-4 this would be done with rods, screws and cages with local bone graft and allograft (donor bone graft). Plan to remove the rods from previous surgery and replace with longer rods extending to the new 2 levels  above being fused.  Take hydrocodone for for pain. Risk of surgery includes risk of infection 1 in 200 patients, bleeding 1/2% chance you would need a transfusion.   Risk to the nerves is one in 10,000. You will need to use a brace for 3 months and wean from the brace on the 4th month. Expect improved walking and standing tolerance. Expect relief of leg pain but numbness may persist depending on the length and degree of pressure that has been present.  Follow-Up Instructions: Return in about 4 weeks (around 08/25/2021).   Orders:  No orders of the defined types were placed in this encounter.  No orders of the defined types were placed in this encounter.     Procedures: No procedures performed   Clinical Data: Findings:  CLINICAL DATA:  Low back pain, symptoms persist with greater than 6 weeks treatment. Spondylosis. Low back pain radiating to  the left leg with weakness.   EXAM: MRI LUMBAR SPINE WITHOUT AND WITH CONTRAST   TECHNIQUE: Multiplanar and multiecho pulse sequences of the lumbar spine were obtained without and with intravenous contrast.   CONTRAST:  7.76mL GADAVIST GADOBUTROL 1 MMOL/ML IV SOLN   COMPARISON:  Radiography 07/08/2021.  MRI 03/28/2020   FINDINGS: Segmentation: 5 lumbar type vertebral bodies as numbered previously.   Alignment: Fixed anterolisthesis at L5-S1 of 3 mm. 2 mm retrolisthesis at L2-3 and L3-4.   Vertebrae: Previous discectomy and fusion procedure from L4 to the sacrum.   Conus medullaris and cauda equina: Conus extends to the L2 level. Conus and cauda equina appear normal.   Paraspinal and other soft tissues: Negative. Previously seen postoperative subcutaneous fluid collection has resolved.   Disc levels:   T10-11: Facet and ligamentous hypertrophy right worse than left with canal encroachment and possible deformity of the distal corner. Could this relate to any the patient's symptoms?   T11-12: Minimal facet hypertrophy.  No compressive stenosis.   T12-L1 and L1-2: Normal disc spaces. Mild facet hypertrophy at L1-2 but no stenosis.   L2-3: Endplate osteophytes and bulging of the disc. Mild facet and ligamentous hypertrophy. Mild stenosis of the lateral recesses without neural compression.   L3-4: 2 mm retrolisthesis. Disc degeneration with endplate osteophytes and broad-based protrusion of the disc. Extruded fragment  migrated caudally in the left lateral recess down to mid L4. Facet hypertrophy. Multifactorial stenosis at the disc level that could be significant. Left-sided neural compression quite possible due to the extruded migrated fragment.   L4-5 and L5-S1: Previous posterior decompression, diskectomy and fusion. No complicating feature seen. Apparent sufficient patency of the canal and foramina.   IMPRESSION: Previous discectomy, posterior decompression and  fusion from L4 to the sacrum. Resolution of the previously seen posterior fluid collection.   Marked worsening of disease at the L3-4 level. 2 mm retrolisthesis. Broad-based disc herniation more prominent towards the left. Extruded fragment migrated caudally in the left lateral recess. Facet and ligamentous hypertrophy. Stenosis at the disc level could be significant. Left-sided stenosis because of the caudally migrated fragment could be significant.   L2-3: 2 mm retrolisthesis. Bulging of the disc. Facet hypertrophy. Mild stenosis without definite neural compression.   T10-11: Pronounced facet and ligamentous hypertrophy with canal encroachment. This may cause some deformity of the distal thoracic cord.       Subjective: Chief Complaint  Patient presents with   Lower Back - Follow-up    MRI Review    56 year old female with history of lumbar fusion 2 levels, has cage with screws and rod in place but is having problems with persistent pain that is worsening into the legs left more than right with pain in the left buttock with radiation into the left anterior thigh and down to the knee and the outside of the left calf. The right side is to the knee mainly. Standing and coughing with feeling of pain and weakness into the legs. Presently taking oxycontin and oxyIR for pain control.   Review of Systems  Constitutional: Negative.   HENT: Negative.    Eyes: Negative.   Respiratory: Negative.    Cardiovascular: Negative.   Gastrointestinal: Negative.   Endocrine: Negative.   Genitourinary: Negative.   Musculoskeletal: Negative.   Skin: Negative.   Allergic/Immunologic: Negative.   Neurological: Negative.   Hematological: Negative.   Psychiatric/Behavioral: Negative.      Objective: Vital Signs: BP 118/79 (BP Location: Left Arm, Patient Position: Sitting)    Pulse 92    Ht 5\' 4"  (1.626 m)    Wt 194 lb (88 kg)    BMI 33.30 kg/m   Physical Exam Constitutional:       Appearance: She is well-developed.  HENT:     Head: Normocephalic and atraumatic.  Eyes:     Pupils: Pupils are equal, round, and reactive to light.  Pulmonary:     Effort: Pulmonary effort is normal.     Breath sounds: Normal breath sounds.  Abdominal:     General: Bowel sounds are normal.     Palpations: Abdomen is soft.  Musculoskeletal:     Cervical back: Normal range of motion and neck supple.     Lumbar back: Negative right straight leg raise test and negative left straight leg raise test.  Skin:    General: Skin is warm and dry.  Neurological:     Mental Status: She is alert and oriented to person, place, and time.  Psychiatric:        Behavior: Behavior normal.        Thought Content: Thought content normal.        Judgment: Judgment normal.   Back Exam   Tenderness  The patient is experiencing tenderness in the lumbar.  Range of Motion  Extension:  abnormal  Flexion:  abnormal  Lateral  bend right:  abnormal  Lateral bend left:  abnormal  Rotation right:  abnormal  Rotation left:  abnormal   Muscle Strength  Right Quadriceps:  5/5  Left Quadriceps:  4/5  Right Hamstrings:  5/5  Left Hamstrings:  5/5   Tests  Straight leg raise right: negative Straight leg raise left: negative  Reflexes  Patellar:  0/4 Achilles:  2/4 Biceps:  0/4 Babinski's sign: normal   Other  Toe walk: normal Heel walk: abnormal Sensation: decreased Gait: antalgic  Erythema: no back redness Scars: present  Comments:  Left knee extension Quad 4/5, left foot DF 4/5. Right knee 5/5 and right foot 5/5 Reverse SLR postive on the left .     Specialty Comments:  No specialty comments available.  Imaging: No results found.   PMFS History: Patient Active Problem List   Diagnosis Date Noted   Wound drainage 03/27/2020   Spondylolisthesis of lumbar region 03/06/2020   Type 2 diabetes mellitus without complication, without long-term current use of insulin (HCC) 12/05/2019    Chronic pain syndrome 12/05/2019   Essential hypertension 12/05/2019   Elevated cholesterol 12/05/2019   Insomnia secondary to depression with anxiety 12/05/2019   Class 2 obesity due to excess calories with body mass index (BMI) of 37.0 to 37.9 in adult 12/05/2019   Healthcare maintenance 12/05/2019   Past Medical History:  Diagnosis Date   Anxiety    Arthritis    Chronic kidney disease    bladder numbness   Depression    Diabetes mellitus without complication (HCC)     Family History  Problem Relation Age of Onset   Diabetes Mother    Cancer Mother    Breast cancer Mother    Stroke Father     Past Surgical History:  Procedure Laterality Date   ABDOMINAL HYSTERECTOMY     APPENDECTOMY     COLON SURGERY     FOOT SURGERY     Social History   Occupational History   Not on file  Tobacco Use   Smoking status: Former   Smokeless tobacco: Never  Vaping Use   Vaping Use: Never used  Substance and Sexual Activity   Alcohol use: Never   Drug use: Never   Sexual activity: Yes

## 2021-09-08 ENCOUNTER — Other Ambulatory Visit: Payer: Self-pay

## 2021-09-15 ENCOUNTER — Ambulatory Visit: Payer: Medicare Other | Admitting: Surgery

## 2021-11-01 ENCOUNTER — Other Ambulatory Visit: Payer: Self-pay | Admitting: Specialist

## 2021-11-30 ENCOUNTER — Encounter: Admission: RE | Payer: Self-pay | Source: Home / Self Care

## 2021-11-30 ENCOUNTER — Inpatient Hospital Stay: Admission: RE | Admit: 2021-11-30 | Payer: Medicare Other | Source: Home / Self Care | Admitting: Specialist

## 2021-11-30 SURGERY — POSTERIOR LUMBAR FUSION 2 WITH HARDWARE REMOVAL
Anesthesia: General

## 2021-12-02 ENCOUNTER — Telehealth: Payer: Self-pay | Admitting: Physician Assistant

## 2021-12-02 DIAGNOSIS — K5732 Diverticulitis of large intestine without perforation or abscess without bleeding: Secondary | ICD-10-CM

## 2021-12-02 NOTE — Progress Notes (Signed)
Patient will need CT +/- drain injection for pelvic drain placed 6/26 at [facility] by Dr. Miles Costain

## 2021-12-03 ENCOUNTER — Other Ambulatory Visit: Payer: Self-pay | Admitting: General Surgery

## 2021-12-03 DIAGNOSIS — K5732 Diverticulitis of large intestine without perforation or abscess without bleeding: Secondary | ICD-10-CM

## 2021-12-09 ENCOUNTER — Encounter: Payer: Self-pay | Admitting: *Deleted

## 2021-12-09 ENCOUNTER — Ambulatory Visit
Admission: RE | Admit: 2021-12-09 | Discharge: 2021-12-09 | Disposition: A | Discharge status: Home / Self Care | Payer: Medicare Other | Source: Ambulatory Visit | Attending: General Surgery | Admitting: General Surgery

## 2021-12-09 ENCOUNTER — Ambulatory Visit
Admission: RE | Admit: 2021-12-09 | Discharge: 2021-12-09 | Disposition: A | Discharge status: Home / Self Care | Payer: Medicare Other | Source: Ambulatory Visit | Attending: Physician Assistant | Admitting: Physician Assistant

## 2021-12-09 DIAGNOSIS — K5732 Diverticulitis of large intestine without perforation or abscess without bleeding: Secondary | ICD-10-CM

## 2021-12-09 HISTORY — PX: IR RADIOLOGIST EVAL & MGMT: IMG5224

## 2021-12-09 NOTE — Progress Notes (Signed)
Referring Physician(s): Watterson,Shannon A  Chief Complaint: The patient is seen in follow up today s/p diverticular abscess drain placed 11/29/21  History of present illness:  Kayla [name], 56 year old female, has a medical history significant for DM, degenerative disc disease at L5-S1 and recurrent diverticular strictures s/p initial sigmoidectomy with primary anastomosis requiring revision for anastomotic stricture. She presented to the [facility] ED 11/10/21 with abdominal tenderness, diarrhea and thinner stools. She was found to have a stricture at an end-to-side anastomosis at the mid to distal rectum and was taken to the OR for exploratory laparotomy with end colostomy 11/11/21. She was discharged home 11/16/21.  She presented to the [facility] ED 11/28/21 with abdominal pain, weakness, nausea and decreased oral intake. CT imaging was positive for a fluid collection adjacent to the sigmoid colon staple line. IR was consulted for drain placement and this was done 11/29/21 by Dr. Miles Costain.   She presents today to the [facility] outpatient clinic for diverticular abscess drain evaluation including CT imaging and drain injection under fluoroscopy. She has been flushing the drain 3 times/day with 5 ml normal saline and she reports a daily output of 5 ml. She denies fevers, chills, nausea or vomiting. She denies any pain. She is not taking any oral antibiotics.    Past Medical History:  Diagnosis Date   Anxiety    Arthritis    Chronic kidney disease    bladder numbness   Depression    Diabetes mellitus without complication (HCC)     Past Surgical History:  Procedure Laterality Date   ABDOMINAL HYSTERECTOMY     APPENDECTOMY     COLON SURGERY     FOOT SURGERY      Allergies: Daptomycin and Vancomycin  Medications: Prior to Admission medications   Medication Sig Start Date End Date Taking? Authorizing Provider  albuterol (VENTOLIN HFA) 108 (90 Base) MCG/ACT inhaler  Inhale into the lungs. 07/22/14   [provider]  budesonide-formoterol (SYMBICORT) 160-4.5 MCG/ACT inhaler Inhale 2 puffs into the lungs in the morning and at bedtime.  07/21/14   [provider]  diclofenac (CATAFLAM) 50 MG tablet TAKE 1 TABLET(50 MG) BY MOUTH TWICE DAILY 11/02/21   Kerrin Champagne, MD  estradiol (ESTRACE) 2 MG tablet Take 1 mg by mouth daily.  06/23/19   [provider]  FLUoxetine (PROZAC) 20 MG capsule Take 20 mg by mouth daily. 06/24/19   [provider]  fluticasone (FLONASE) 50 MCG/ACT nasal spray Place 2 sprays into both nostrils daily.  08/18/14   [provider]  glucose blood test strip  08/04/14   [provider]  glycopyrrolate (ROBINUL) 2 MG tablet Take 2 mg by mouth daily. 06/24/19   [provider]  LINZESS 290 MCG CAPS capsule Take 290 mcg by mouth daily. 07/27/19   [provider]  lisinopril (ZESTRIL) 5 MG tablet Take 5 mg by mouth daily. 06/24/19   [provider]  Magnesium Oxide -Mg Supplement 250 MG TABS Take 250 mg by mouth daily.  08/05/19   [provider]  Melatonin 5 MG CHEW Chew 5 mg by mouth at bedtime. Gummy    [provider]  metFORMIN (GLUCOPHAGE-XR) 500 MG 24 hr tablet Take 2,000 mg by mouth daily.  06/23/19   [provider]  montelukast (SINGULAIR) 10 MG tablet Take 10 mg by mouth at bedtime as needed (allergies.).  06/23/19   [provider]  ondansetron (ZOFRAN) 8 MG tablet Take 8 mg  by mouth daily as needed for nausea or vomiting.  10/14/15   [provider]  oxyCODONE (ROXICODONE) 15 MG immediate release tablet Take 15 mg by mouth 6 (six) times daily.  08/20/19   [provider]  OXYCONTIN 60 MG 12 hr tablet Take 60 mg by mouth 2 (two) times daily.  08/20/19   [provider]  rosuvastatin (CRESTOR) 40 MG tablet Take 40 mg by mouth at bedtime.  06/23/19   [provider]  tiZANidine (ZANAFLEX) 4 MG tablet  Take 2 mg by mouth at bedtime as needed for muscle spasms.    [provider]  zolpidem (AMBIEN) 5 MG tablet Take 5 mg by mouth at bedtime.  09/02/19   [provider]     Family History  Problem Relation Age of Onset   Diabetes Mother    Cancer Mother    Breast cancer Mother    Stroke Father     Social History   Socioeconomic History   Marital status: Legally Separated    Spouse name: Not on file   Number of children: Not on file   Years of education: Not on file   Highest education level: Not on file  Occupational History   Not on file  Tobacco Use   Smoking status: Former   Smokeless tobacco: Never  Vaping Use   Vaping Use: Never used  Substance and Sexual Activity   Alcohol use: Never   Drug use: Never   Sexual activity: Yes  Other Topics Concern   Not on file  Social History Narrative   Not on file   Social Determinants of Health   Financial Resource Strain: Not on file  Food Insecurity: Not on file  Transportation Needs: Not on file  Physical Activity: Not on file  Stress: Not on file  Social Connections: Not on file     Vital Signs: There were no vitals taken for this visit.  Physical Exam Constitutional:      General: She is not in acute distress.    Appearance: She is not ill-appearing.  Pulmonary:     Effort: Pulmonary effort is normal.  Abdominal:     Palpations: Abdomen is soft.     Tenderness: There is no abdominal tenderness.     Comments: Left/mid abdominal colostomy. LLQ drain to suction. Scant material in bulb. Suture and stat-lock intact. Skin insertion site is unremarkable.   Skin:    General: Skin is warm and dry.  Neurological:     Mental Status: She is alert and oriented to person, place, and time.     Imaging: No results found.  Labs:  CBC: No results for input(s): "WBC", "HGB", "HCT", "PLT" in the last 8760 hours.  COAGS: No results for input(s): "INR", "APTT" in the last 8760 hours.  BMP: No  results for input(s): "NA", "K", "CL", "CO2", "GLUCOSE", "BUN", "CALCIUM", "CREATININE", "GFRNONAA", "GFRAA" in the last 8760 hours.  Invalid input(s): "CMP"  LIVER FUNCTION TESTS: No results for input(s): "BILITOT", "AST", "ALT", "ALKPHOS", "PROT", "ALBUMIN" in the last 8760 hours.  Assessment:  History of diverticular strictures s/p exploratory laparotomy with end colostomy. Post-op fluid collection s/p drain placement in IR 11/29/21  CT abdomen/pelvis shows complete resolution of the left pelvic fluid collection. Please see full dictation under Imaging tab in Epic. The patient reports a daily output of 5 ml and she denies pain, nausea, vomiting, fevers or chills. Contrast drain injection under fluoroscopy was negative for a fistulous connection to the  bowel.   Given all of these findings, the drain was removed. The patient tolerated drain removal well. She was instructed to keep the site covered until tomorrow and possibly longer if there is any oozing/drainage. She was advised to call our clinic if she develops any redness, swelling, pain or purulent drainage from the catheter site. She was advised to call her doctor or go to the ED if she develops fevers, chills, nausea, vomiting or abdominal pain.   She said she does not have a follow up with her surgeon at this time but she plans to call him soon to arrange a follow up. She knows she can call our clinic with any drain-related questions or concerns.    Signed: Mickie Kay, NP 12/09/2021, 2:00 PM   Please refer to Dr. Francia Greaves attestation of this note for management and plan.

## 2021-12-21 ENCOUNTER — Other Ambulatory Visit: Payer: Medicare Other

## 2022-01-12 ENCOUNTER — Encounter (INDEPENDENT_AMBULATORY_CARE_PROVIDER_SITE_OTHER): Payer: Self-pay

## 2022-04-04 ENCOUNTER — Ambulatory Visit: Payer: Medicare Other | Admitting: Orthopedic Surgery

## 2022-04-11 ENCOUNTER — Ambulatory Visit: Payer: Medicare Other | Admitting: Orthopedic Surgery

## 2022-06-24 ENCOUNTER — Ambulatory Visit: Payer: Self-pay

## 2022-06-24 ENCOUNTER — Ambulatory Visit (INDEPENDENT_AMBULATORY_CARE_PROVIDER_SITE_OTHER): Payer: Medicare Other | Admitting: Orthopedic Surgery

## 2022-06-24 VITALS — BP 98/64 | HR 69 | Ht 63.5 in | Wt 130.0 lb

## 2022-06-24 DIAGNOSIS — E119 Type 2 diabetes mellitus without complications: Secondary | ICD-10-CM

## 2022-06-24 DIAGNOSIS — M5416 Radiculopathy, lumbar region: Secondary | ICD-10-CM | POA: Diagnosis not present

## 2022-06-24 NOTE — Progress Notes (Signed)
Orthopedic Spine Surgery Office Note  Assessment: Patient is a 57 y.o. female with low back pain that radiates into her left lateral hip and thigh. MRI shows central with lateral recess stenosis above her old fusion at L3/4 and lateral recess stenosis at L2/3.    Plan: -Explained that initially conservative treatment is tried as a significant number of patients may experience relief with these treatment modalities. Discussed that the conservative treatments include:  -activity modification  -physical therapy  -over the counter pain medications  -medrol dosepak  -lumbar steroid injections -Patient has tried physical therapy, NSAIDs, Tylenol, Medrol Dosepak, steroid injection -We talked about surgery in the form of L3-4 decompression and extension of fusion.  My partner had previously recommended including L2-3.  I am not sure that she is having symptoms from that level.  She does have lateral recess stenosis at L2-3.  I want her to get a L3/4 transforaminal injection for diagnostic and therapeutic purposes.  If she gets significant relief with that injection, I would plan to just do a L3/4 decompression and extension of fusion. -I told her that her risk of surgery would be higher given that is a revision.  She had a durotomy at the prior surgery in her thecal sac canal sits next to the right S1 screw so she would be at particular risk for a dural tear.  I also told her that her infection risk would be higher since it is a revision surgery.  I went over some of the other risks with surgery but told her we would go more in depth with them if she starts to seriously consider surgery as an option -Explained that surgery would be done for her leg pain and does not reliably relieve back pain -Patient should return to office in 6 weeks, x-rays at next visit: none   Patient expressed understanding of the plan and all questions were answered to the patient's satisfaction.    ___________________________________________________________________________   History:  Patient is a 57 y.o. female who presents today for lumbar spine. Patient had surgery with Dr. Cyndy Freeze in Oct 5956 which was complicated by a seroma that required drainage. Prior notes from Dr. Louanne Skye report a dural tear during her index surgery as well.  Patient comes in today with over a year of low back pain that radiates into the left hip.  She feels it along the lateral aspect of the hip and into the anterior lateral aspects of the thigh.  No right-sided symptoms.  Pain is described as burning and sometimes gets paresthesias.  Pain has been getting progressively worse with time.  No other numbness or paresthesias.  Pain does not extend past the knee on the left side.  Of note, patient has a history of chronic low back pain and is in pain management   Weakness: yes, left hip feels weaker. No other weakness noted Symptoms of imbalance: denies Paresthesias and numbness: yes, into left hip and thigh. No other numbness or paresthesias Bowel or bladder incontinence: denies Saddle anesthesia: denies  Treatments tried: physical therapy, NSAIDs, Tylenol, Medrol Dosepak, steroid injection  Review of systems: Denies fevers and chills, night sweats, unexplained weight loss, history of cancer, pain that wakes them at night  Past medical history: Hyperlipidemia Anxiety CKD Depression DM (last A1c was 5.1 on 05/10/2022)  Allergies: daptomycin, vancomycin  Past surgical history:  L4-S1 TLIF and PSIF with Dr. Cyndy Freeze (03/2020) Hysterectomy Colon resection with colostomy Carpal tunnel release bilaterally Appendectomy Right bunionectomy Right ear tube placement  Social history: Denies use of nicotine product (smoking, vaping, patches, smokeless) Alcohol use: denies Denies recreational drug use  Physical Exam:  General: no acute distress, appears stated age Neurologic: alert, answering questions  appropriately, following commands Respiratory: unlabored breathing on room air, symmetric chest rise Psychiatric: appropriate affect, normal cadence to speech   MSK (spine):  -Strength exam      Left  Right EHL    5/5  5/5 TA    5/5  5/5 GSC    5/5  5/5 Knee extension  5/5  5/5 Hip flexion   4+/5  5/5  -Sensory exam    Sensation intact to light touch in L3-S1 nerve distributions of bilateral lower extremities  -Achilles DTR: 1/4 on the left, 1/4 on the right -Patellar tendon DTR: 1/4 on the left, 1/4 on the right  -Straight leg raise: negative -Contralateral straight leg raise: negative -Femoral nerve stretch test: negative bilaterally -Clonus: no beats bilaterally  -Left hip exam: Positive FADIR, no pain through remainder of range of motion, negative FABER, negative Stinchfield -Right hip exam: Positive FADIR, no pain through remainder of range of motion, negative FABER, negative Stinchfield  Imaging: XR of the lumbar spine from 06/24/2021 was independently reviewed and interpreted, showing L4/5 and L5/S1 interbody devices and posterior instrumentation in place. There is no lucency around the screws. No broken instrumentation. Screws have not backed out. Disc height loss at L2/3 and L3/4. Retrolisthesis at L3/4. No evidence of instability on flexion/extension views. No fracture or dislocation seen. PI of 60. LL of 57.  MRI of the lumbar spine from 07/24/2021 was independently reviewed and interpreted, showing DDD at L2/3 and L3/4. Left sided paracentral disc herniation at L3/4 that has migrated caudally. Central and lateral recess stenosis at L3/4. Lateral recess stenosis at L2/3. Previous interbody devices at L4/5 and L5/S1 with instrumentation. No evidence of complication though MRI not best modality to evaluate for fusion or instrumentation complication.    Patient name: Kayla [name]. Patient MRN: [id number] Date of visit: 06/24/22

## 2022-06-30 ENCOUNTER — Ambulatory Visit (INDEPENDENT_AMBULATORY_CARE_PROVIDER_SITE_OTHER): Payer: Medicare Other | Admitting: Physical Medicine and Rehabilitation

## 2022-06-30 ENCOUNTER — Ambulatory Visit: Payer: Medicare Other

## 2022-06-30 VITALS — BP 108/70 | HR 65

## 2022-06-30 DIAGNOSIS — M5416 Radiculopathy, lumbar region: Secondary | ICD-10-CM

## 2022-06-30 MED ORDER — METHYLPREDNISOLONE ACETATE 80 MG/ML IJ SUSP
80.0000 mg | Freq: Once | INTRAMUSCULAR | Status: AC
  Administered 2022-06-30: 80 mg

## 2022-06-30 NOTE — Patient Instructions (Signed)

## 2022-06-30 NOTE — Progress Notes (Signed)
Functional Pain Scale - descriptive words and definitions  Unmanageable (7)  Pain interferes with normal ADL's/nothing seems to help/sleep is very difficult/active distractions are very difficult to concentrate on. Severe range order  Average Pain 7   +Driver, -BT, -Dye Allergies.  Lower back pain on left that radiates down the left leg to the outside of the calf

## 2022-07-05 NOTE — Progress Notes (Signed)
Kayla [name] - 57 y.o. [name] MRN [id number]  Date of birth: 18-Feb-1966  Office Visit Note: Visit Date: 06/30/2022 PCP: Glendon Axe, MD Referred by: Callie Fielding, MD  Subjective: Chief Complaint  Patient presents with   Lower Back - Pain   HPI:  Kayla [name] is a 57 y.o. [name] who comes in today at the request of Dr. Ileene Rubens for planned Left L3-4 Lumbar Transforaminal epidural steroid injection with fluoroscopic guidance.  The patient has failed conservative care including home exercise, medications, time and activity modification.  This injection will be diagnostic and hopefully therapeutic.  Please see requesting physician notes for further details and justification.   ROS Otherwise per HPI.  Assessment & Plan: Visit Diagnoses:    ICD-10-CM   1. Lumbar radiculopathy  M54.16 XR C-ARM NO REPORT    Epidural Steroid injection    methylPREDNISolone acetate (DEPO-MEDROL) injection 80 mg      Plan: No additional findings.   Meds & Orders:  Meds ordered this encounter  Medications   methylPREDNISolone acetate (DEPO-MEDROL) injection 80 mg    Orders Placed This Encounter  Procedures   XR C-ARM NO REPORT   Epidural Steroid injection    Follow-up: Return for visit to requesting provider as needed.   Procedures: No procedures performed  Lumbosacral Transforaminal Epidural Steroid Injection - Sub-Pedicular Approach with Fluoroscopic Guidance  Patient: Kayla [name].      Date of Birth: 31-Mar-1966 MRN: [id number] PCP: Glendon Axe, MD      Visit Date: 06/30/2022   Universal Protocol:    Date/Time: 06/30/2022  Consent Given By: the patient  Position: PRONE  Additional Comments: Vital signs were monitored before and after the procedure. Patient was prepped and draped in the usual sterile fashion. The correct patient, procedure, and site was verified.   Injection Procedure Details:   Procedure diagnoses: Lumbar  radiculopathy [M54.16]    Meds Administered:  Meds ordered this encounter  Medications   methylPREDNISolone acetate (DEPO-MEDROL) injection 80 mg    Laterality: Left  Location/Site: L3  Needle:5.0 in., 22 ga.  Short bevel or Quincke spinal needle  Needle Placement: Transforaminal  Findings:    -Comments: Excellent flow of contrast along the nerve, nerve root and into the epidural space.  Procedure Details: After squaring off the end-plates to get a true AP view, the C-arm was positioned so that an oblique view of the foramen as noted above was visualized. The target area is just inferior to the "nose of the scotty dog" or sub pedicular. The soft tissues overlying this structure were infiltrated with 2-3 ml. of 1% Lidocaine without Epinephrine.  The spinal needle was inserted toward the target using a "trajectory" view along the fluoroscope beam.  Under AP and lateral visualization, the needle was advanced so it did not puncture dura and was located close the 6 O'Clock position of the pedical in AP tracterory. Biplanar projections were used to confirm position. Aspiration was confirmed to be negative for CSF and/or blood. A 1-2 ml. volume of Isovue-250 was injected and flow of contrast was noted at each level. Radiographs were obtained for documentation purposes.   After attaining the desired flow of contrast documented above, a 0.5 to 1.0 ml test dose of 0.25% Marcaine was injected into each respective transforaminal space.  The patient was observed for 90 seconds post injection.  After no sensory deficits were reported, and normal lower extremity motor function was noted,   the above  injectate was administered so that equal amounts of the injectate were placed at each foramen (level) into the transforaminal epidural space.   Additional Comments:  No complications occurred Dressing: 2 [name] 2 sterile gauze and Band-Aid    Post-procedure details: Patient was observed during the  procedure. Post-procedure instructions were reviewed.  Patient left the clinic in stable condition.    Clinical History: MRI LUMBAR SPINE WITHOUT AND WITH CONTRAST   TECHNIQUE: Multiplanar and multiecho pulse sequences of the lumbar spine were obtained without and with intravenous contrast.   CONTRAST:  7.96mL GADAVIST GADOBUTROL 1 MMOL/ML IV SOLN   COMPARISON:  Radiography 07/08/2021.  MRI 03/28/2020   FINDINGS: Segmentation: 5 lumbar type vertebral bodies as numbered previously.   Alignment: Fixed anterolisthesis at L5-S1 of 3 mm. 2 mm retrolisthesis at L2-3 and L3-4.   Vertebrae: Previous discectomy and fusion procedure from L4 to the sacrum.   Conus medullaris and cauda equina: Conus extends to the L2 level. Conus and cauda equina appear normal.   Paraspinal and other soft tissues: Negative. Previously seen postoperative subcutaneous fluid collection has resolved.   Disc levels:   T10-11: Facet and ligamentous hypertrophy right worse than left with canal encroachment and possible deformity of the distal corner. Could this relate to any the patient's symptoms?   T11-12: Minimal facet hypertrophy.  No compressive stenosis.   T12-L1 and L1-2: Normal disc spaces. Mild facet hypertrophy at L1-2 but no stenosis.   L2-3: Endplate osteophytes and bulging of the disc. Mild facet and ligamentous hypertrophy. Mild stenosis of the lateral recesses without neural compression.   L3-4: 2 mm retrolisthesis. Disc degeneration with endplate osteophytes and broad-based protrusion of the disc. Extruded fragment migrated caudally in the left lateral recess down to mid L4. Facet hypertrophy. Multifactorial stenosis at the disc level that could be significant. Left-sided neural compression quite possible due to the extruded migrated fragment.   L4-5 and L5-S1: Previous posterior decompression, diskectomy and fusion. No complicating feature seen. Apparent sufficient patency of the  canal and foramina.   IMPRESSION: Previous discectomy, posterior decompression and fusion from L4 to the sacrum. Resolution of the previously seen posterior fluid collection.   Marked worsening of disease at the L3-4 level. 2 mm retrolisthesis. Broad-based disc herniation more prominent towards the left. Extruded fragment migrated caudally in the left lateral recess. Facet and ligamentous hypertrophy. Stenosis at the disc level could be significant. Left-sided stenosis because of the caudally migrated fragment could be significant.   L2-3: 2 mm retrolisthesis. Bulging of the disc. Facet hypertrophy. Mild stenosis without definite neural compression.   T10-11: Pronounced facet and ligamentous hypertrophy with canal encroachment. This may cause some deformity of the distal thoracic cord.     Electronically Signed   By: Nelson Chimes M.D.   On: 07/25/2021 21:31     Objective:  VS:  HT:    WT:   BMI:     BP:108/70  HR:65bpm  TEMP: ( )  RESP:  Physical Exam Vitals and nursing note reviewed.  Constitutional:      General: She is not in acute distress.    Appearance: Normal appearance. She is not ill-appearing.  HENT:     Head: Normocephalic and atraumatic.     Right Ear: External ear normal.     Left Ear: External ear normal.  Eyes:     Extraocular Movements: Extraocular movements intact.  Cardiovascular:     Rate and Rhythm: Normal rate.     Pulses: Normal pulses.  Pulmonary:     Effort: Pulmonary effort is normal. No respiratory distress.  Abdominal:     General: There is no distension.     Palpations: Abdomen is soft.  Musculoskeletal:        General: Tenderness present.     Cervical back: Neck supple.     Right lower leg: No edema.     Left lower leg: No edema.     Comments: Patient has good distal strength with no pain over the greater trochanters.  No clonus or focal weakness.  Skin:    Findings: No erythema, lesion or rash.  Neurological:     General: No  focal deficit present.     Mental Status: She is alert and oriented to person, place, and time.     Sensory: No sensory deficit.     Motor: No weakness or abnormal muscle tone.     Coordination: Coordination normal.  Psychiatric:        Mood and Affect: Mood normal.        Behavior: Behavior normal.      Imaging: No results found.

## 2022-07-05 NOTE — Procedures (Signed)
Lumbosacral Transforaminal Epidural Steroid Injection - Sub-Pedicular Approach with Fluoroscopic Guidance  Patient: Kayla Monceaux X.      Date of Birth: 09/07/1965 MRN: [id number] PCP: Glendon Axe, MD      Visit Date: 06/30/2022   Universal Protocol:    Date/Time: 06/30/2022  Consent Given By: the patient  Position: PRONE  Additional Comments: Vital signs were monitored before and after the procedure. Patient was prepped and draped in the usual sterile fashion. The correct patient, procedure, and site was verified.   Injection Procedure Details:   Procedure diagnoses: Lumbar radiculopathy [M54.16]    Meds Administered:  Meds ordered this encounter  Medications   methylPREDNISolone acetate (DEPO-MEDROL) injection 80 mg    Laterality: Left  Location/Site: L3  Needle:5.0 in., 22 ga.  Short bevel or Quincke spinal needle  Needle Placement: Transforaminal  Findings:    -Comments: Excellent flow of contrast along the nerve, nerve root and into the epidural space.  Procedure Details: After squaring off the end-plates to get a true AP view, the C-arm was positioned so that an oblique view of the foramen as noted above was visualized. The target area is just inferior to the "nose of the scotty dog" or sub pedicular. The soft tissues overlying this structure were infiltrated with 2-3 ml. of 1% Lidocaine without Epinephrine.  The spinal needle was inserted toward the target using a "trajectory" view along the fluoroscope beam.  Under AP and lateral visualization, the needle was advanced so it did not puncture dura and was located close the 6 O'Clock position of the pedical in AP tracterory. Biplanar projections were used to confirm position. Aspiration was confirmed to be negative for CSF and/or blood. A 1-2 ml. volume of Isovue-250 was injected and flow of contrast was noted at each level. Radiographs were obtained for documentation purposes.   After attaining the desired  flow of contrast documented above, a 0.5 to 1.0 ml test dose of 0.25% Marcaine was injected into each respective transforaminal space.  The patient was observed for 90 seconds post injection.  After no sensory deficits were reported, and normal lower extremity motor function was noted,   the above injectate was administered so that equal amounts of the injectate were placed at each foramen (level) into the transforaminal epidural space.   Additional Comments:  No complications occurred Dressing: 2 x 2 sterile gauze and Band-Aid    Post-procedure details: Patient was observed during the procedure. Post-procedure instructions were reviewed.  Patient left the clinic in stable condition.

## 2022-08-05 ENCOUNTER — Ambulatory Visit: Payer: Medicare Other | Admitting: Orthopedic Surgery

## 2022-08-11 ENCOUNTER — Ambulatory Visit: Payer: Medicare Other | Admitting: Orthopedic Surgery

## 2022-08-25 ENCOUNTER — Ambulatory Visit (INDEPENDENT_AMBULATORY_CARE_PROVIDER_SITE_OTHER): Payer: Medicare Other | Admitting: Orthopedic Surgery

## 2022-08-25 DIAGNOSIS — M5416 Radiculopathy, lumbar region: Secondary | ICD-10-CM

## 2022-08-25 NOTE — Progress Notes (Signed)
Orthopedic Spine Surgery Office Note  Assessment: Patient is a 57 y.o. female with low back pain that radiates into her left lower extremity.  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  If her leg pain comes back then, we can talk further about surgery as a potential treatment option.  Since she got such great relief with L3/4 injection, would plan to just address that level with a decompression and fusion.  Due to her prior durotomy and the obliquity of her current posterior instrumentation construct, would likely use connectors in this case as opposed to taking out all of the prior instrumentation -Patient should return to office in 12 weeks, x-rays at next visit: AP/lateral/flex/ex lumbar   Patient expressed understanding of the plan and all questions were answered to the patient's satisfaction.   ___________________________________________________________________________   History:  Patient is a 57 y.o. female who who has been previously seen in the office for her low back pain that radiates into the left lower extremity.  She feels it radiates into the left lateral hip and into the thigh.  After her last visit, she received an injection at L3/4.  She states she got significant relief with that injection.  Her leg pain is much better.  She is still having back pain that is similar to before the injection.  She states that she is able to do a lot more activity because her leg pain is better.  She said her leg pain was more bothersome than her back pain.  Still not having any right-sided symptoms.  Injection is still working for her.  Has gotten paresthesias and numbness in the left lateral hip and thigh.  No other numbness or paresthesias.   Treatments  tried: physical therapy, NSAIDs, Tylenol, Medrol Dosepak, steroid injection    Physical Exam:  General: no acute distress, appears stated age Neurologic: alert, answering questions appropriately, following commands Respiratory: unlabored breathing on room air, symmetric chest rise Psychiatric: appropriate affect, normal cadence to speech   MSK (spine):  -Strength exam      Left  Right EHL    5/5  5/5 TA    5/5  5/5 GSC    5/5  5/5 Knee extension  5/5  5/5 Hip flexion   4+/5  4+/5  -Sensory exam    Sensation intact to light touch in L3-S1 nerve distributions of bilateral lower extremities  -Achilles DTR: 1/4 on the left, 1/4 on the right -Patellar tendon DTR: 1/4 on the left, 1/4 on the right  -Straight leg raise: Negative bilaterally  -Femoral nerve stretch test: Negative bilaterally -Clonus: no beats bilaterally  -Left hip exam: No pain through range of motion, negative FABER, negative Stinchfield -Right hip exam: Negative Stinchfield, no pain through range of motion, negative FABER  Imaging: XR of the lumbar spine from 06/24/2021 was previously independently reviewed and interpreted, showing L4/5 and L5/S1 interbody devices and posterior instrumentation in place. There is no lucency around the screws. No broken instrumentation. Screws have not backed out. Disc height loss at L2/3 and L3/4. Retrolisthesis at L3/4. No evidence of instability on flexion/extension views. No fracture or dislocation seen. PI of 60. LL of 57.   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 Left sided paracentral disc herniation at L3/4 that has migrated caudally. Central and lateral recess stenosis at L3/4. Lateral recess stenosis at L2/3. Previous interbody devices at L4/5 and L5/S1 with instrumentation. No evidence of complication though MRI not best modality to evaluate for fusion or instrumentation complication.     Patient name: Kayla [name]. Patient MRN: [id number] Date of visit: 08/25/22

## 2022-11-21 ENCOUNTER — Ambulatory Visit: Payer: Medicare Other | Admitting: Orthopedic Surgery

## 2023-06-05 ENCOUNTER — Ambulatory Visit (INDEPENDENT_AMBULATORY_CARE_PROVIDER_SITE_OTHER): Payer: Medicare Other | Admitting: Orthopedic Surgery

## 2023-06-05 ENCOUNTER — Other Ambulatory Visit (INDEPENDENT_AMBULATORY_CARE_PROVIDER_SITE_OTHER): Payer: Medicare Other

## 2023-06-05 DIAGNOSIS — M5416 Radiculopathy, lumbar region: Secondary | ICD-10-CM

## 2023-06-05 NOTE — Progress Notes (Addendum)
Orthopedic Spine Surgery Office Note   Assessment: Patient is a 57 y.o. female with low back pain that radiates into her left lower extremity.  Has central lateral recess stenosis as well as disc herniation at L3/4 causing radiculopathy     Plan: -Patient has tried physical therapy, NSAIDs, Tylenol, Medrol Dosepak, steroid injection, pain management -Patient has tried conservative treatment for over 12 months now without any relief of her pain, so discussed surgery as a treatment option. Specifically discussed a L3 and L4 segment decompression with extension of her fusion to L3. Given her prior durotomy that appears to be around L5/S1 on the right side, I told her that I would plan to use connectors or possibly even cut the rod below the L4 pedicle screws to avoid getting into the prior durotomy. She has a CT ab/pelvis that shows bridging bone across her interbody device at the L4/5 and there is no lucency seen around any of the posterior instrumentation, consistent with successful fusion. I went over the risks, benefits, and alternatives of this surgery. Afterwards, she elected to proceed -I did tell her that her MRI nearly 59 years old so I would like to get a new one prior to surgery to evaluate the L2/3 level and the disc that could have resorbed as this would potentially change the surgical plan. I told her that I would call her if there does need to be a change in the plan and would go over the rationale for the change -Patient should will next be seen at the date of surgery     The patient has symptoms consistent with lumbar stenosis. The patient's symptoms were not improving with conservative treatment so operative management was discussed in the form of L3 and L4 segment laminectomies with partial medial facetectomies and discectomy along with extension of her fusion to L3. The risks including but not limited to dural tear, nerve root injury, paralysis, persistent pain, pseudarthrosis, infection,  bleeding, need for transfusion, hardware failure/malposition, adjacent segment disease, heart attack, dvt/pe, death, and need for additional procedures were discussed with the patient. I explained that her risk of infection and durotomy would be higher given that this is a revision case. The benefit of the surgery would be improvement in the patient's radiating leg pain. I explained that back pain relief is not the goal of the surgery and it is not reliably alleviated with this surgery. The alternatives to surgical management were covered with the patient and included continued monitoring, physical therapy, over-the-counter pain medications, ambulatory aids, repeat injections, and activity modification. All the patient's questions were answered to her satisfaction. After this discussion, the patient expressed understanding and elected to proceed with surgical intervention.      ___________________________________________________________________________     History:   Patient is a 57 y.o. female who who has been previously seen in the office for her low back pain that radiates into the left lower extremity. It has been awhile since I have seen her. Her symptoms have gotten progressively worse and she has now developed right sided symptoms. She reports pain that starts in her low back and goes into the left buttock, lateral thigh, and into the anterior leg. She also has pain that radiates into the right anterolateral thigh that she feels like electric shocks. Her left leg pain is unchanged since our last visit, except that it is a more intense pain. She feels the pain with activity and at rest. She has the pain everyday. She has gotten good,  but temporary relief with an injection in the past. No other treatment has provided her with more than a few hours of relief. She is currently in pain management. Has a colostomy so no fecal incontinence or change in bowel habits noted. No urinary incontinence. No saddle  anesthesia.      Treatments tried: physical therapy, NSAIDs, Tylenol, Medrol Dosepak, steroid injection, pain management     Physical Exam:   General: no acute distress, appears stated age Neurologic: alert, answering questions appropriately, following commands Respiratory: unlabored breathing on room air, symmetric chest rise Psychiatric: appropriate affect, normal cadence to speech     MSK (spine):   -Strength exam                                                   Left                  Right EHL                              5/5                  5/5 TA                                 5/5                  5/5 GSC                             5/5                  5/5 Knee extension            5/5                  5/5 Hip flexion                    4+/5                4+/5   -Sensory exam                           Sensation intact to light touch in L3-S1 nerve distributions of bilateral lower extremities   -Achilles DTR: 1/4 on the left, 1/4 on the right -Patellar tendon DTR: 1/4 on the left, 1/4 on the right   -Straight leg raise: Negative bilaterally  -Femoral nerve stretch test: Negative bilaterally -Clonus: no beats bilaterally   -Left hip exam: No pain through range of motion, negative FABER, negative Stinchfield, negative SI joint compression test -Right hip exam: Negative Stinchfield, no pain through range of motion, negative FABER, negative SI joint compression test   Imaging: XRs of the lumbar spine from 06/05/2023 were independently reviewed and interpreted, showing L4/5 and L5/S1 interbody devices and posterior instrumentation in place. There is no lucency around the screws or the interbody devices. No broken instrumentation. Disc height loss at L2/3 and L3/4. Retrolisthesis at L3/4. Degenerative scoliosis with apex to the left that measures 12 degrees. No evidence of instability on flexion/extension views. No fracture or dislocation seen. PI of 60.  LL of 58.   MRI  of the lumbar spine from 07/24/2021 was previously independently reviewed and interpreted, showing DDD at L2/3 and L3/4. Left sided paracentral disc herniation at L3/4 that has migrated caudally. Central and lateral recess stenosis at L3/4. Lateral recess stenosis at L2/3. Previous interbody devices at L4/5 and L5/S1 with instrumentation. No evidence of complication though MRI not best modality to evaluate for fusion or instrumentation complication.      Patient name: Kayla [name]. Patient MRN: [id number] Date of visit: 06/05/23  Pre-operative Scores  ODI: 64% VAS back: 7 VAS leg: 8

## 2023-06-06 ENCOUNTER — Telehealth (HOSPITAL_BASED_OUTPATIENT_CLINIC_OR_DEPARTMENT_OTHER): Payer: Self-pay | Admitting: Orthopedic Surgery

## 2023-06-08 ENCOUNTER — Ambulatory Visit: Payer: Medicare Other | Admitting: Orthopedic Surgery

## 2023-06-25 ENCOUNTER — Ambulatory Visit (HOSPITAL_BASED_OUTPATIENT_CLINIC_OR_DEPARTMENT_OTHER)
Admission: RE | Admit: 2023-06-25 | Discharge: 2023-06-25 | Disposition: A | Discharge status: Home / Self Care | Payer: Medicare Other | Source: Ambulatory Visit | Attending: Orthopedic Surgery | Admitting: Orthopedic Surgery

## 2023-06-25 DIAGNOSIS — M5416 Radiculopathy, lumbar region: Secondary | ICD-10-CM | POA: Insufficient documentation

## 2024-04-08 ENCOUNTER — Encounter: Payer: Self-pay | Admitting: Radiology
